# Patient Record
Sex: Female | Born: 1940 | Race: Black or African American | Hispanic: No | Smoking: Never smoker
Health system: Southern US, Community
[De-identification: ages and names within clinical notes are randomized; demographics above are authoritative.]

## PROBLEM LIST (undated history)

## (undated) DIAGNOSIS — E079 Disorder of thyroid, unspecified: Secondary | ICD-10-CM

## (undated) DIAGNOSIS — I10 Essential (primary) hypertension: Secondary | ICD-10-CM

## (undated) DIAGNOSIS — F0390 Unspecified dementia without behavioral disturbance: Secondary | ICD-10-CM

## (undated) HISTORY — DX: Unspecified dementia, unspecified severity, without behavioral disturbance, psychotic disturbance, mood disturbance, and anxiety: F03.90

## (undated) HISTORY — PX: ABDOMINAL HYSTERECTOMY: SHX81

---

## 2005-02-05 ENCOUNTER — Ambulatory Visit: Payer: Self-pay

## 2006-03-01 ENCOUNTER — Ambulatory Visit: Payer: Self-pay | Admitting: Internal Medicine

## 2007-03-10 ENCOUNTER — Ambulatory Visit: Payer: Self-pay | Admitting: Internal Medicine

## 2007-03-18 ENCOUNTER — Ambulatory Visit: Payer: Self-pay | Admitting: Internal Medicine

## 2008-01-30 ENCOUNTER — Ambulatory Visit: Payer: Self-pay | Admitting: Gastroenterology

## 2008-03-22 ENCOUNTER — Ambulatory Visit: Payer: Self-pay | Admitting: Gastroenterology

## 2008-06-07 ENCOUNTER — Ambulatory Visit: Payer: Self-pay | Admitting: Internal Medicine

## 2009-07-29 ENCOUNTER — Ambulatory Visit: Payer: Self-pay | Admitting: Internal Medicine

## 2010-07-30 ENCOUNTER — Ambulatory Visit: Payer: Self-pay | Admitting: Internal Medicine

## 2011-09-18 ENCOUNTER — Ambulatory Visit: Payer: Self-pay | Admitting: Internal Medicine

## 2012-09-20 ENCOUNTER — Ambulatory Visit: Payer: Self-pay | Admitting: Internal Medicine

## 2013-09-21 ENCOUNTER — Ambulatory Visit: Payer: Self-pay | Admitting: Internal Medicine

## 2014-10-17 ENCOUNTER — Ambulatory Visit: Payer: Self-pay | Admitting: Internal Medicine

## 2014-12-04 DIAGNOSIS — G309 Alzheimer's disease, unspecified: Secondary | ICD-10-CM | POA: Diagnosis not present

## 2014-12-04 DIAGNOSIS — E039 Hypothyroidism, unspecified: Secondary | ICD-10-CM | POA: Diagnosis not present

## 2014-12-04 DIAGNOSIS — I10 Essential (primary) hypertension: Secondary | ICD-10-CM | POA: Diagnosis not present

## 2014-12-04 DIAGNOSIS — D519 Vitamin B12 deficiency anemia, unspecified: Secondary | ICD-10-CM | POA: Diagnosis not present

## 2014-12-13 ENCOUNTER — Ambulatory Visit: Payer: Self-pay | Admitting: Internal Medicine

## 2014-12-13 DIAGNOSIS — R413 Other amnesia: Secondary | ICD-10-CM | POA: Diagnosis not present

## 2014-12-13 DIAGNOSIS — I679 Cerebrovascular disease, unspecified: Secondary | ICD-10-CM | POA: Diagnosis not present

## 2014-12-13 DIAGNOSIS — G319 Degenerative disease of nervous system, unspecified: Secondary | ICD-10-CM | POA: Diagnosis not present

## 2014-12-13 DIAGNOSIS — I638 Other cerebral infarction: Secondary | ICD-10-CM | POA: Diagnosis not present

## 2014-12-28 DIAGNOSIS — D519 Vitamin B12 deficiency anemia, unspecified: Secondary | ICD-10-CM | POA: Diagnosis not present

## 2015-01-04 DIAGNOSIS — G309 Alzheimer's disease, unspecified: Secondary | ICD-10-CM | POA: Diagnosis not present

## 2015-01-04 DIAGNOSIS — R5381 Other malaise: Secondary | ICD-10-CM | POA: Diagnosis not present

## 2015-01-04 DIAGNOSIS — D519 Vitamin B12 deficiency anemia, unspecified: Secondary | ICD-10-CM | POA: Diagnosis not present

## 2015-01-15 DIAGNOSIS — D519 Vitamin B12 deficiency anemia, unspecified: Secondary | ICD-10-CM | POA: Diagnosis not present

## 2015-01-15 DIAGNOSIS — M810 Age-related osteoporosis without current pathological fracture: Secondary | ICD-10-CM | POA: Diagnosis not present

## 2015-01-15 DIAGNOSIS — E039 Hypothyroidism, unspecified: Secondary | ICD-10-CM | POA: Diagnosis not present

## 2015-01-15 DIAGNOSIS — I10 Essential (primary) hypertension: Secondary | ICD-10-CM | POA: Diagnosis not present

## 2015-01-15 DIAGNOSIS — G309 Alzheimer's disease, unspecified: Secondary | ICD-10-CM | POA: Diagnosis not present

## 2015-01-25 DIAGNOSIS — D519 Vitamin B12 deficiency anemia, unspecified: Secondary | ICD-10-CM | POA: Diagnosis not present

## 2015-02-11 DIAGNOSIS — E039 Hypothyroidism, unspecified: Secondary | ICD-10-CM | POA: Diagnosis not present

## 2015-02-12 DIAGNOSIS — H2513 Age-related nuclear cataract, bilateral: Secondary | ICD-10-CM | POA: Diagnosis not present

## 2015-05-06 ENCOUNTER — Ambulatory Visit
Admission: RE | Admit: 2015-05-06 | Discharge: 2015-05-06 | Disposition: A | Discharge status: Home / Self Care | Payer: Medicare Other | Source: Ambulatory Visit | Attending: Physician Assistant | Admitting: Physician Assistant

## 2015-05-06 ENCOUNTER — Other Ambulatory Visit: Payer: Self-pay | Admitting: Physician Assistant

## 2015-05-06 DIAGNOSIS — R069 Unspecified abnormalities of breathing: Secondary | ICD-10-CM

## 2015-05-06 DIAGNOSIS — R0689 Other abnormalities of breathing: Secondary | ICD-10-CM | POA: Diagnosis not present

## 2015-05-06 DIAGNOSIS — R634 Abnormal weight loss: Secondary | ICD-10-CM | POA: Diagnosis not present

## 2015-05-06 DIAGNOSIS — E860 Dehydration: Secondary | ICD-10-CM | POA: Diagnosis not present

## 2015-05-06 DIAGNOSIS — G309 Alzheimer's disease, unspecified: Secondary | ICD-10-CM | POA: Diagnosis not present

## 2015-05-06 DIAGNOSIS — R42 Dizziness and giddiness: Secondary | ICD-10-CM | POA: Diagnosis not present

## 2015-05-06 DIAGNOSIS — D519 Vitamin B12 deficiency anemia, unspecified: Secondary | ICD-10-CM | POA: Diagnosis not present

## 2015-05-06 DIAGNOSIS — N390 Urinary tract infection, site not specified: Secondary | ICD-10-CM | POA: Diagnosis not present

## 2015-05-06 DIAGNOSIS — E039 Hypothyroidism, unspecified: Secondary | ICD-10-CM | POA: Diagnosis not present

## 2015-05-09 DIAGNOSIS — E039 Hypothyroidism, unspecified: Secondary | ICD-10-CM | POA: Diagnosis not present

## 2015-05-09 DIAGNOSIS — G309 Alzheimer's disease, unspecified: Secondary | ICD-10-CM | POA: Diagnosis not present

## 2015-05-09 DIAGNOSIS — D519 Vitamin B12 deficiency anemia, unspecified: Secondary | ICD-10-CM | POA: Diagnosis not present

## 2015-05-09 DIAGNOSIS — N390 Urinary tract infection, site not specified: Secondary | ICD-10-CM | POA: Diagnosis not present

## 2015-05-09 DIAGNOSIS — D72819 Decreased white blood cell count, unspecified: Secondary | ICD-10-CM | POA: Diagnosis not present

## 2015-05-31 DIAGNOSIS — E039 Hypothyroidism, unspecified: Secondary | ICD-10-CM | POA: Diagnosis not present

## 2015-06-04 DIAGNOSIS — I10 Essential (primary) hypertension: Secondary | ICD-10-CM | POA: Diagnosis not present

## 2015-06-04 DIAGNOSIS — D519 Vitamin B12 deficiency anemia, unspecified: Secondary | ICD-10-CM | POA: Diagnosis not present

## 2015-06-04 DIAGNOSIS — E039 Hypothyroidism, unspecified: Secondary | ICD-10-CM | POA: Diagnosis not present

## 2015-06-04 DIAGNOSIS — G309 Alzheimer's disease, unspecified: Secondary | ICD-10-CM | POA: Diagnosis not present

## 2015-06-16 ENCOUNTER — Encounter: Payer: Self-pay | Admitting: Emergency Medicine

## 2015-06-16 ENCOUNTER — Other Ambulatory Visit: Payer: Self-pay

## 2015-06-16 ENCOUNTER — Emergency Department: Payer: Medicare Other

## 2015-06-16 ENCOUNTER — Emergency Department
Admission: EM | Admit: 2015-06-16 | Discharge: 2015-06-16 | Disposition: A | Discharge status: Home / Self Care | Payer: Medicare Other | Attending: Emergency Medicine | Admitting: Emergency Medicine

## 2015-06-16 DIAGNOSIS — Z880 Allergy status to penicillin: Secondary | ICD-10-CM | POA: Diagnosis not present

## 2015-06-16 DIAGNOSIS — I10 Essential (primary) hypertension: Secondary | ICD-10-CM | POA: Diagnosis not present

## 2015-06-16 DIAGNOSIS — R110 Nausea: Secondary | ICD-10-CM | POA: Diagnosis not present

## 2015-06-16 DIAGNOSIS — R109 Unspecified abdominal pain: Secondary | ICD-10-CM | POA: Diagnosis not present

## 2015-06-16 HISTORY — DX: Disorder of thyroid, unspecified: E07.9

## 2015-06-16 HISTORY — DX: Essential (primary) hypertension: I10

## 2015-06-16 LAB — CBC WITH DIFFERENTIAL/PLATELET
Basophils Absolute: 0 10*3/uL (ref 0–0.1)
Basophils Relative: 0 %
Eosinophils Absolute: 0 10*3/uL (ref 0–0.7)
Eosinophils Relative: 0 %
HCT: 39.1 % (ref 35.0–47.0)
Hemoglobin: 12.3 g/dL (ref 12.0–16.0)
Lymphocytes Relative: 18 %
Lymphs Abs: 0.5 10*3/uL — ABNORMAL LOW (ref 1.0–3.6)
MCH: 26 pg (ref 26.0–34.0)
MCHC: 31.6 g/dL — ABNORMAL LOW (ref 32.0–36.0)
MCV: 82.4 fL (ref 80.0–100.0)
Monocytes Absolute: 0.3 10*3/uL (ref 0.2–0.9)
Monocytes Relative: 11 %
Neutro Abs: 2.2 10*3/uL (ref 1.4–6.5)
Neutrophils Relative %: 71 %
Platelets: 191 10*3/uL (ref 150–440)
RBC: 4.74 MIL/uL (ref 3.80–5.20)
RDW: 14.1 % (ref 11.5–14.5)
WBC: 3 10*3/uL — ABNORMAL LOW (ref 3.6–11.0)

## 2015-06-16 LAB — COMPREHENSIVE METABOLIC PANEL
ALT: 23 U/L (ref 14–54)
AST: 28 U/L (ref 15–41)
Albumin: 4.4 g/dL (ref 3.5–5.0)
Alkaline Phosphatase: 78 U/L (ref 38–126)
Anion gap: 8 (ref 5–15)
BUN: 27 mg/dL — ABNORMAL HIGH (ref 6–20)
CO2: 30 mmol/L (ref 22–32)
Calcium: 9.7 mg/dL (ref 8.9–10.3)
Chloride: 101 mmol/L (ref 101–111)
Creatinine, Ser: 0.82 mg/dL (ref 0.44–1.00)
GFR calc Af Amer: >60 mL/min (ref >60)
GFR calc non Af Amer: >60 mL/min (ref >60)
Glucose, Bld: 142 mg/dL — ABNORMAL HIGH (ref 65–99)
Potassium: 3.7 mmol/L (ref 3.5–5.1)
Sodium: 139 mmol/L (ref 135–145)
Total Bilirubin: 0.6 mg/dL (ref 0.3–1.2)
Total Protein: 8.5 g/dL — ABNORMAL HIGH (ref 6.5–8.1)

## 2015-06-16 LAB — URINALYSIS COMPLETE WITH MICROSCOPIC (ARMC ONLY)
Bacteria, UA: NONE SEEN
Bilirubin Urine: NEGATIVE
Glucose, UA: NEGATIVE mg/dL
Hgb urine dipstick: NEGATIVE
Ketones, ur: NEGATIVE mg/dL
Leukocytes, UA: NEGATIVE
Nitrite: NEGATIVE
Protein, ur: NEGATIVE mg/dL
RBC / HPF: NONE SEEN RBC/hpf (ref 0–5)
Specific Gravity, Urine: 1.01 (ref 1.005–1.030)
pH: 7 (ref 5.0–8.0)

## 2015-06-16 LAB — LIPASE, BLOOD: Lipase: 52 U/L — ABNORMAL HIGH (ref 22–51)

## 2015-06-16 MED ORDER — ONDANSETRON HCL 4 MG/2ML IJ SOLN
4.0000 mg | Freq: Once | INTRAMUSCULAR | Status: AC
  Administered 2015-06-16: 4 mg via INTRAVENOUS
  Filled 2015-06-16: qty 2

## 2015-06-16 MED ORDER — SODIUM CHLORIDE 0.9 % IV BOLUS (SEPSIS)
500.0000 mL | Freq: Once | INTRAVENOUS | Status: AC
  Administered 2015-06-16: 500 mL via INTRAVENOUS

## 2015-06-16 MED ORDER — ONDANSETRON HCL 4 MG PO TABS: 4.0000 mg | ORAL_TABLET | Freq: Every day | ORAL | Status: DC | PRN

## 2015-06-16 NOTE — ED Provider Notes (Signed)
[facility] Emergency Department Provider Note  ____________________________________________  Time seen: 1505 I have reviewed the triage vital signs and the nursing notes.   HISTORY  Chief Complaint Abdominal Pain     HPI Joanna Aguilar is a 74 y.o. female who presents to the emergency department today with nausea. Her family is with her and helps with history, although the patient is quite alert and communicative and does not appear to need much help in conveying her symptoms.  She reports she awoke this morning and felt fine. She was getting ready for church and was premature ready when she began to feel nauseous. This lasted for approximately 15 minutes. She lay down on the bed and the symptoms slowly eased. She has been feeling fairly well since then although she began to have some nausea in the half hour before I saw her here in the emergency department.  The patient denies any abdominal pain, although she does report she gets a funny feeling around her belly button at times. She has not had any diarrhea. While she is nauseous, she has not had any actual emesis.  The symptoms initially began 2-3 months ago. The patient and family report that it happens 2-3 times per week. The patient has discussed this with her primary physician, Dr. Humphrey Rolls at [facility]. She was advised to come to the emergency department time this occurred.  The patient denies any headache or weakness. She is not having any chest pain. She is not having any syncope or near syncopal episodes.   Past Medical History  Diagnosis Date  . Thyroid disease   . Hypertension     There are no active problems to display for this patient.   Past Surgical History  Procedure Laterality Date  . Abdominal hysterectomy      Current Outpatient Rx  Name  Route  Sig  Dispense  Refill  . ondansetron (ZOFRAN) 4 MG tablet   Oral   Take 1 tablet (4 mg total) by mouth daily as needed for nausea or  vomiting.   10 tablet   0     Allergies Penicillins  History reviewed. No pertinent family history.  Social History History  Substance Use Topics  . Smoking status: Never Smoker   . Smokeless tobacco: Not on file  . Alcohol Use: No    Review of Systems  Constitutional: Negative for fever. ENT: Negative for sore throat. Cardiovascular: Negative for chest pain. Respiratory: Negative for shortness of breath. Gastrointestinal: Negative for abdominal pain, vomiting and diarrhea. Positive for nausea Genitourinary: Negative for dysuria. Musculoskeletal: No myalgias or injuries. Skin: Negative for rash. Neurological: Negative for headaches   10-point ROS otherwise negative.  ____________________________________________   PHYSICAL EXAM:  VITAL SIGNS: ED Triage Vitals  Enc Vitals Group     BP 06/16/15 1149 154/83 mmHg     Pulse Rate 06/16/15 1149 70     Resp 06/16/15 1149 18     Temp 06/16/15 1149 97 F (36.1 C)     Temp Source 06/16/15 1149 Oral     SpO2 06/16/15 1149 97 %     Weight 06/16/15 1149 94 lb (42.638 kg)     Height 06/16/15 1149 5\' 2"  (1.575 m)     Head Cir --      Peak Flow --      Pain Score 06/16/15 1149 5     Pain Loc --      Pain Edu? --  Excl. in [place]? --     Constitutional: Alert and communicative.  Well appearing and in no distress. ENT   Head: Normocephalic and atraumatic.   Nose: No congestion/rhinnorhea.      Ears: Normal canals and TMs. There is a small amount of wax in the left canal with no obstruction.   Mouth/Throat: Mucous membranes are moist. Cardiovascular: Normal rate, regular rhythm, no murmur noted Respiratory:  Normal respiratory effort, no tachypnea.    Breath sounds are clear and equal bilaterally.  Gastrointestinal: Soft and nontender. Muscular. No distention.  Back: No muscle spasm, no tenderness, no CVA tenderness. Musculoskeletal: No deformity noted. Nontender with normal range of motion in all  extremities.  No noted edema. Neurologic:  Normal speech and language. No gross focal neurologic deficits are appreciated.  Skin:  Skin is warm, dry. No rash noted. Psychiatric: Mood and affect are normal. Speech and behavior are normal.  ____________________________________________    LABS (pertinent positives/negatives)  Labs Reviewed  CBC WITH DIFFERENTIAL/PLATELET - Abnormal; Notable for the following:    WBC 3.0 (*)    MCHC 31.6 (*)    Lymphs Abs 0.5 (*)    All other components within normal limits  COMPREHENSIVE METABOLIC PANEL - Abnormal; Notable for the following:    Glucose, Bld 142 (*)    BUN 27 (*)    Total Protein 8.5 (*)    All other components within normal limits  LIPASE, BLOOD - Abnormal; Notable for the following:    Lipase 52 (*)    All other components within normal limits  URINALYSIS COMPLETEWITH MICROSCOPIC ([facility] ONLY) - Abnormal; Notable for the following:    Color, Urine STRAW (*)    APPearance CLEAR (*)    Squamous Epithelial / LPF 0-5 (*)    All other components within normal limits     ____________________________________________   EKG  ED ECG REPORT I, Vedha Tercero W, the attending physician, personally viewed and interpreted this ECG.   Date: 06/16/2015  EKG Time: 1202  Rate: 80  Rhythm: Normal sinus rhythm  Axis: Normal  Intervals: Normal  ST&T Change: Downward T in lead 3 and aVF   ____________________________________________    RADIOLOGY  Ultrasound right upper quadrant:  IMPRESSION: Negative right upper quadrant ultrasound.  ____________________________________________   INITIAL IMPRESSION / ASSESSMENT AND PLAN / ED COURSE  Pertinent labs & imaging results that were available during my care of the patient were reviewed by me and considered in my medical decision making (see chart for details).  Pleasant, well-appearing 74 year old female who in no acute distress. Interview was prolonged as be clarified her symptoms  and what was in was not happening. She is not having any fainting or near syncopal episodes. She is not having any abdominal pain. She has intermittent nausea that lasted 15 minutes today. These episodes occur 2-3 times a week and began to 3 months ago.  I will order Zofran for her and obtain an ultrasound of the right quadrant to be sure she is not having any biliary disease contributing to this nausea. I do have low suspicion for this. If the ultrasound is normal we will discharge her home with his prescription for Zofran and instructions to follow-up with her primary physician.  ----------------------------------------- 5:41 PM on 06/16/2015 -----------------------------------------  Patient has a normal right upper quadrant ultrasound. No biliary disease.  On reexam, she is not having any nausea at this time. We'll prescribe Zofran for home use. She will follow with her primary care physician.  ____________________________________________   FINAL CLINICAL IMPRESSION(S) / ED DIAGNOSES  Final diagnoses:  Nausea      Ahmed Prima, MD 06/16/15 1744

## 2015-06-16 NOTE — ED Notes (Signed)
Woke up with feeling sick on stomach  Nausea .

## 2015-06-16 NOTE — Discharge Instructions (Signed)
It is unclear what is causing your intermittent nausea. Your blood tests overall look good. Your ultrasound does not show any acute problems with her gallbladder. You felt better after receiving Zofran and some fluids here in the emergency department. We have prescribed Zofran to take in case of episodes of nausea. Follow-up with your regular doctor for ongoing evaluation. Return to the emergency department if you have worsening symptoms, abdominal pain, or other urgent concerns.   Nausea, Adult Nausea means you feel sick to your stomach or need to throw up (vomit). It may be a sign of a more serious problem. If nausea gets worse, you may throw up. If you throw up a lot, you may lose too much body fluid (dehydration). HOME CARE   Get plenty of rest.  Ask your doctor how to replace body fluid losses (rehydrate).  Eat small amounts of food. Sip liquids more often.  Take all medicines as told by your doctor. GET HELP RIGHT AWAY IF:  You have a fever.  You pass out (faint).  You keep throwing up or have blood in your throw up.  You are very weak, have dry lips or a dry mouth, or you are very thirsty (dehydrated).  You have dark or bloody poop (stool).  You have very bad chest or belly (abdominal) pain.  You do not get better after 2 days, or you get worse.  You have a headache. MAKE SURE YOU:  Understand these instructions.  Will watch your condition.  Will get help right away if you are not doing well or get worse. Document Released: 10/22/2011 Document Revised: 01/25/2012 Document Reviewed: 10/22/2011 [facility] Patient Information 2015 [place], [place]. This information is not intended to replace advice given to you by your health care provider. Make sure you discuss any questions you have with your health care provider.

## 2015-06-16 NOTE — Progress Notes (Addendum)
   06/16/15 1800  Clinical Encounter Type  Visited With Family  Visit Type Spiritual support  Spiritual Encounters  Spiritual Needs Prayer  Stress Factors  Family Stress Factors Health changes   Status: discharged/73 female/Peripheral IV 06/16/15 Right Antecubital Family: daughter and female relative Visit Assessment: Prior to discharging, the patient's family introduced themselves and had a jovial conversation with the chaplain down in the cafeteria. The chaplain gave the family encouraging words and will lift the patient and her family up in prayer for healing and comfort. The patient's daughter shared that her mother was in the ER.  Pastoral care's pager number is [phone] or we can be reached online

## 2015-07-04 DIAGNOSIS — I10 Essential (primary) hypertension: Secondary | ICD-10-CM | POA: Diagnosis not present

## 2015-07-04 DIAGNOSIS — E039 Hypothyroidism, unspecified: Secondary | ICD-10-CM | POA: Diagnosis not present

## 2015-07-09 DIAGNOSIS — G309 Alzheimer's disease, unspecified: Secondary | ICD-10-CM | POA: Diagnosis not present

## 2015-07-09 DIAGNOSIS — E039 Hypothyroidism, unspecified: Secondary | ICD-10-CM | POA: Diagnosis not present

## 2015-07-09 DIAGNOSIS — D519 Vitamin B12 deficiency anemia, unspecified: Secondary | ICD-10-CM | POA: Diagnosis not present

## 2015-07-09 DIAGNOSIS — I10 Essential (primary) hypertension: Secondary | ICD-10-CM | POA: Diagnosis not present

## 2015-07-24 DIAGNOSIS — M818 Other osteoporosis without current pathological fracture: Secondary | ICD-10-CM | POA: Diagnosis not present

## 2015-07-24 DIAGNOSIS — I10 Essential (primary) hypertension: Secondary | ICD-10-CM | POA: Diagnosis not present

## 2015-07-24 DIAGNOSIS — R600 Localized edema: Secondary | ICD-10-CM | POA: Diagnosis not present

## 2015-07-24 DIAGNOSIS — E0500 Thyrotoxicosis with diffuse goiter without thyrotoxic crisis or storm: Secondary | ICD-10-CM | POA: Diagnosis not present

## 2015-07-24 DIAGNOSIS — E890 Postprocedural hypothyroidism: Secondary | ICD-10-CM | POA: Diagnosis not present

## 2015-07-31 DIAGNOSIS — M818 Other osteoporosis without current pathological fracture: Secondary | ICD-10-CM | POA: Diagnosis not present

## 2015-07-31 DIAGNOSIS — E890 Postprocedural hypothyroidism: Secondary | ICD-10-CM | POA: Diagnosis not present

## 2015-07-31 DIAGNOSIS — I10 Essential (primary) hypertension: Secondary | ICD-10-CM | POA: Diagnosis not present

## 2015-07-31 DIAGNOSIS — E0500 Thyrotoxicosis with diffuse goiter without thyrotoxic crisis or storm: Secondary | ICD-10-CM | POA: Diagnosis not present

## 2015-08-06 DIAGNOSIS — E890 Postprocedural hypothyroidism: Secondary | ICD-10-CM | POA: Diagnosis not present

## 2015-08-06 DIAGNOSIS — D519 Vitamin B12 deficiency anemia, unspecified: Secondary | ICD-10-CM | POA: Diagnosis not present

## 2015-08-06 DIAGNOSIS — M818 Other osteoporosis without current pathological fracture: Secondary | ICD-10-CM | POA: Diagnosis not present

## 2015-08-06 DIAGNOSIS — E0500 Thyrotoxicosis with diffuse goiter without thyrotoxic crisis or storm: Secondary | ICD-10-CM | POA: Diagnosis not present

## 2015-08-06 DIAGNOSIS — R600 Localized edema: Secondary | ICD-10-CM | POA: Diagnosis not present

## 2015-08-13 DIAGNOSIS — E890 Postprocedural hypothyroidism: Secondary | ICD-10-CM | POA: Diagnosis not present

## 2015-08-13 DIAGNOSIS — R600 Localized edema: Secondary | ICD-10-CM | POA: Diagnosis not present

## 2015-08-13 DIAGNOSIS — I10 Essential (primary) hypertension: Secondary | ICD-10-CM | POA: Diagnosis not present

## 2015-08-13 DIAGNOSIS — E0500 Thyrotoxicosis with diffuse goiter without thyrotoxic crisis or storm: Secondary | ICD-10-CM | POA: Diagnosis not present

## 2015-08-13 DIAGNOSIS — M818 Other osteoporosis without current pathological fracture: Secondary | ICD-10-CM | POA: Diagnosis not present

## 2015-08-14 DIAGNOSIS — M818 Other osteoporosis without current pathological fracture: Secondary | ICD-10-CM | POA: Diagnosis not present

## 2015-08-14 DIAGNOSIS — E0500 Thyrotoxicosis with diffuse goiter without thyrotoxic crisis or storm: Secondary | ICD-10-CM | POA: Diagnosis not present

## 2015-08-14 DIAGNOSIS — E890 Postprocedural hypothyroidism: Secondary | ICD-10-CM | POA: Diagnosis not present

## 2015-09-06 DIAGNOSIS — M818 Other osteoporosis without current pathological fracture: Secondary | ICD-10-CM | POA: Diagnosis not present

## 2015-09-06 DIAGNOSIS — E0500 Thyrotoxicosis with diffuse goiter without thyrotoxic crisis or storm: Secondary | ICD-10-CM | POA: Diagnosis not present

## 2015-09-06 DIAGNOSIS — E890 Postprocedural hypothyroidism: Secondary | ICD-10-CM | POA: Diagnosis not present

## 2015-09-06 DIAGNOSIS — R600 Localized edema: Secondary | ICD-10-CM | POA: Diagnosis not present

## 2015-09-13 DIAGNOSIS — E0500 Thyrotoxicosis with diffuse goiter without thyrotoxic crisis or storm: Secondary | ICD-10-CM | POA: Diagnosis not present

## 2015-09-13 DIAGNOSIS — D519 Vitamin B12 deficiency anemia, unspecified: Secondary | ICD-10-CM | POA: Diagnosis not present

## 2015-09-13 DIAGNOSIS — I10 Essential (primary) hypertension: Secondary | ICD-10-CM | POA: Diagnosis not present

## 2015-09-13 DIAGNOSIS — E890 Postprocedural hypothyroidism: Secondary | ICD-10-CM | POA: Diagnosis not present

## 2015-09-13 DIAGNOSIS — R600 Localized edema: Secondary | ICD-10-CM | POA: Diagnosis not present

## 2015-10-22 DIAGNOSIS — E890 Postprocedural hypothyroidism: Secondary | ICD-10-CM | POA: Diagnosis not present

## 2015-10-22 DIAGNOSIS — D519 Vitamin B12 deficiency anemia, unspecified: Secondary | ICD-10-CM | POA: Diagnosis not present

## 2015-10-22 DIAGNOSIS — E0500 Thyrotoxicosis with diffuse goiter without thyrotoxic crisis or storm: Secondary | ICD-10-CM | POA: Diagnosis not present

## 2015-10-25 DIAGNOSIS — R600 Localized edema: Secondary | ICD-10-CM | POA: Diagnosis not present

## 2015-10-25 DIAGNOSIS — E0500 Thyrotoxicosis with diffuse goiter without thyrotoxic crisis or storm: Secondary | ICD-10-CM | POA: Diagnosis not present

## 2015-10-25 DIAGNOSIS — I10 Essential (primary) hypertension: Secondary | ICD-10-CM | POA: Diagnosis not present

## 2015-10-25 DIAGNOSIS — E890 Postprocedural hypothyroidism: Secondary | ICD-10-CM | POA: Diagnosis not present

## 2015-11-13 ENCOUNTER — Other Ambulatory Visit: Payer: Self-pay | Admitting: Physician Assistant

## 2015-11-13 DIAGNOSIS — Z0001 Encounter for general adult medical examination with abnormal findings: Secondary | ICD-10-CM | POA: Diagnosis not present

## 2015-11-13 DIAGNOSIS — Z1231 Encounter for screening mammogram for malignant neoplasm of breast: Secondary | ICD-10-CM

## 2015-11-13 DIAGNOSIS — D519 Vitamin B12 deficiency anemia, unspecified: Secondary | ICD-10-CM | POA: Diagnosis not present

## 2015-11-13 DIAGNOSIS — G309 Alzheimer's disease, unspecified: Secondary | ICD-10-CM | POA: Diagnosis not present

## 2015-11-13 DIAGNOSIS — I10 Essential (primary) hypertension: Secondary | ICD-10-CM | POA: Diagnosis not present

## 2015-11-13 DIAGNOSIS — E039 Hypothyroidism, unspecified: Secondary | ICD-10-CM | POA: Diagnosis not present

## 2015-11-13 DIAGNOSIS — Z7689 Persons encountering health services in other specified circumstances: Secondary | ICD-10-CM | POA: Diagnosis not present

## 2015-11-21 ENCOUNTER — Ambulatory Visit
Admission: RE | Admit: 2015-11-21 | Discharge: 2015-11-21 | Disposition: A | Discharge status: Home / Self Care | Payer: Medicare Other | Source: Ambulatory Visit | Attending: Physician Assistant | Admitting: Physician Assistant

## 2015-11-21 ENCOUNTER — Other Ambulatory Visit: Payer: Self-pay | Admitting: Physician Assistant

## 2015-11-21 DIAGNOSIS — Z1231 Encounter for screening mammogram for malignant neoplasm of breast: Secondary | ICD-10-CM | POA: Insufficient documentation

## 2015-12-20 DIAGNOSIS — D519 Vitamin B12 deficiency anemia, unspecified: Secondary | ICD-10-CM | POA: Diagnosis not present

## 2016-01-17 DIAGNOSIS — E0500 Thyrotoxicosis with diffuse goiter without thyrotoxic crisis or storm: Secondary | ICD-10-CM | POA: Diagnosis not present

## 2016-01-24 DIAGNOSIS — M818 Other osteoporosis without current pathological fracture: Secondary | ICD-10-CM | POA: Diagnosis not present

## 2016-01-24 DIAGNOSIS — D519 Vitamin B12 deficiency anemia, unspecified: Secondary | ICD-10-CM | POA: Diagnosis not present

## 2016-01-24 DIAGNOSIS — E890 Postprocedural hypothyroidism: Secondary | ICD-10-CM | POA: Diagnosis not present

## 2016-01-24 DIAGNOSIS — I10 Essential (primary) hypertension: Secondary | ICD-10-CM | POA: Diagnosis not present

## 2016-01-24 DIAGNOSIS — E0500 Thyrotoxicosis with diffuse goiter without thyrotoxic crisis or storm: Secondary | ICD-10-CM | POA: Diagnosis not present

## 2016-02-19 DIAGNOSIS — D519 Vitamin B12 deficiency anemia, unspecified: Secondary | ICD-10-CM | POA: Diagnosis not present

## 2016-03-12 DIAGNOSIS — Z0001 Encounter for general adult medical examination with abnormal findings: Secondary | ICD-10-CM | POA: Diagnosis not present

## 2016-03-12 DIAGNOSIS — D519 Vitamin B12 deficiency anemia, unspecified: Secondary | ICD-10-CM | POA: Diagnosis not present

## 2016-03-12 DIAGNOSIS — I10 Essential (primary) hypertension: Secondary | ICD-10-CM | POA: Diagnosis not present

## 2016-03-12 DIAGNOSIS — G309 Alzheimer's disease, unspecified: Secondary | ICD-10-CM | POA: Diagnosis not present

## 2016-03-12 DIAGNOSIS — M810 Age-related osteoporosis without current pathological fracture: Secondary | ICD-10-CM | POA: Diagnosis not present

## 2016-03-12 DIAGNOSIS — E039 Hypothyroidism, unspecified: Secondary | ICD-10-CM | POA: Diagnosis not present

## 2016-04-24 DIAGNOSIS — M818 Other osteoporosis without current pathological fracture: Secondary | ICD-10-CM | POA: Diagnosis not present

## 2016-04-24 DIAGNOSIS — E0500 Thyrotoxicosis with diffuse goiter without thyrotoxic crisis or storm: Secondary | ICD-10-CM | POA: Diagnosis not present

## 2016-04-24 DIAGNOSIS — D519 Vitamin B12 deficiency anemia, unspecified: Secondary | ICD-10-CM | POA: Diagnosis not present

## 2016-05-01 DIAGNOSIS — I10 Essential (primary) hypertension: Secondary | ICD-10-CM | POA: Diagnosis not present

## 2016-05-01 DIAGNOSIS — M818 Other osteoporosis without current pathological fracture: Secondary | ICD-10-CM | POA: Diagnosis not present

## 2016-05-01 DIAGNOSIS — E890 Postprocedural hypothyroidism: Secondary | ICD-10-CM | POA: Diagnosis not present

## 2016-05-21 DIAGNOSIS — D519 Vitamin B12 deficiency anemia, unspecified: Secondary | ICD-10-CM | POA: Diagnosis not present

## 2016-06-24 DIAGNOSIS — D519 Vitamin B12 deficiency anemia, unspecified: Secondary | ICD-10-CM | POA: Diagnosis not present

## 2016-07-13 DIAGNOSIS — D519 Vitamin B12 deficiency anemia, unspecified: Secondary | ICD-10-CM | POA: Diagnosis not present

## 2016-07-13 DIAGNOSIS — I10 Essential (primary) hypertension: Secondary | ICD-10-CM | POA: Diagnosis not present

## 2016-07-13 DIAGNOSIS — J309 Allergic rhinitis, unspecified: Secondary | ICD-10-CM | POA: Diagnosis not present

## 2016-07-13 DIAGNOSIS — E039 Hypothyroidism, unspecified: Secondary | ICD-10-CM | POA: Diagnosis not present

## 2016-08-12 DIAGNOSIS — D519 Vitamin B12 deficiency anemia, unspecified: Secondary | ICD-10-CM | POA: Diagnosis not present

## 2016-09-09 DIAGNOSIS — D519 Vitamin B12 deficiency anemia, unspecified: Secondary | ICD-10-CM | POA: Diagnosis not present

## 2016-10-15 DIAGNOSIS — D519 Vitamin B12 deficiency anemia, unspecified: Secondary | ICD-10-CM | POA: Diagnosis not present

## 2016-10-16 ENCOUNTER — Other Ambulatory Visit: Payer: Self-pay | Admitting: Physician Assistant

## 2016-10-16 ENCOUNTER — Other Ambulatory Visit: Payer: Self-pay | Admitting: Internal Medicine

## 2016-10-16 DIAGNOSIS — Z1231 Encounter for screening mammogram for malignant neoplasm of breast: Secondary | ICD-10-CM

## 2016-10-30 DIAGNOSIS — M818 Other osteoporosis without current pathological fracture: Secondary | ICD-10-CM | POA: Diagnosis not present

## 2016-10-30 DIAGNOSIS — I10 Essential (primary) hypertension: Secondary | ICD-10-CM | POA: Diagnosis not present

## 2016-10-30 DIAGNOSIS — E0500 Thyrotoxicosis with diffuse goiter without thyrotoxic crisis or storm: Secondary | ICD-10-CM | POA: Diagnosis not present

## 2016-10-30 DIAGNOSIS — E890 Postprocedural hypothyroidism: Secondary | ICD-10-CM | POA: Diagnosis not present

## 2016-10-30 DIAGNOSIS — R600 Localized edema: Secondary | ICD-10-CM | POA: Diagnosis not present

## 2016-11-03 DIAGNOSIS — E039 Hypothyroidism, unspecified: Secondary | ICD-10-CM | POA: Diagnosis not present

## 2016-11-03 DIAGNOSIS — I10 Essential (primary) hypertension: Secondary | ICD-10-CM | POA: Diagnosis not present

## 2016-11-03 DIAGNOSIS — D519 Vitamin B12 deficiency anemia, unspecified: Secondary | ICD-10-CM | POA: Diagnosis not present

## 2016-11-03 DIAGNOSIS — G309 Alzheimer's disease, unspecified: Secondary | ICD-10-CM | POA: Diagnosis not present

## 2016-11-13 DIAGNOSIS — E890 Postprocedural hypothyroidism: Secondary | ICD-10-CM | POA: Diagnosis not present

## 2016-11-13 DIAGNOSIS — M818 Other osteoporosis without current pathological fracture: Secondary | ICD-10-CM | POA: Diagnosis not present

## 2016-11-13 DIAGNOSIS — E0500 Thyrotoxicosis with diffuse goiter without thyrotoxic crisis or storm: Secondary | ICD-10-CM | POA: Diagnosis not present

## 2016-11-13 DIAGNOSIS — R600 Localized edema: Secondary | ICD-10-CM | POA: Diagnosis not present

## 2016-11-26 ENCOUNTER — Ambulatory Visit
Admission: RE | Admit: 2016-11-26 | Discharge: 2016-11-26 | Disposition: A | Discharge status: Home / Self Care | Payer: Medicare Other | Source: Ambulatory Visit | Attending: Internal Medicine | Admitting: Internal Medicine

## 2016-11-26 DIAGNOSIS — Z1231 Encounter for screening mammogram for malignant neoplasm of breast: Secondary | ICD-10-CM | POA: Insufficient documentation

## 2016-12-31 DIAGNOSIS — D519 Vitamin B12 deficiency anemia, unspecified: Secondary | ICD-10-CM | POA: Diagnosis not present

## 2017-03-15 DIAGNOSIS — D519 Vitamin B12 deficiency anemia, unspecified: Secondary | ICD-10-CM | POA: Diagnosis not present

## 2017-03-15 DIAGNOSIS — I10 Essential (primary) hypertension: Secondary | ICD-10-CM | POA: Diagnosis not present

## 2017-03-15 DIAGNOSIS — R112 Nausea with vomiting, unspecified: Secondary | ICD-10-CM | POA: Diagnosis not present

## 2017-03-31 DIAGNOSIS — H6123 Impacted cerumen, bilateral: Secondary | ICD-10-CM | POA: Diagnosis not present

## 2017-03-31 DIAGNOSIS — H6691 Otitis media, unspecified, right ear: Secondary | ICD-10-CM | POA: Diagnosis not present

## 2017-03-31 DIAGNOSIS — J0100 Acute maxillary sinusitis, unspecified: Secondary | ICD-10-CM | POA: Diagnosis not present

## 2017-04-14 DIAGNOSIS — D519 Vitamin B12 deficiency anemia, unspecified: Secondary | ICD-10-CM | POA: Diagnosis not present

## 2017-05-13 DIAGNOSIS — E890 Postprocedural hypothyroidism: Secondary | ICD-10-CM | POA: Diagnosis not present

## 2017-05-13 DIAGNOSIS — E0500 Thyrotoxicosis with diffuse goiter without thyrotoxic crisis or storm: Secondary | ICD-10-CM | POA: Diagnosis not present

## 2017-05-13 DIAGNOSIS — M818 Other osteoporosis without current pathological fracture: Secondary | ICD-10-CM | POA: Diagnosis not present

## 2017-05-13 DIAGNOSIS — I10 Essential (primary) hypertension: Secondary | ICD-10-CM | POA: Diagnosis not present

## 2017-05-13 DIAGNOSIS — R600 Localized edema: Secondary | ICD-10-CM | POA: Diagnosis not present

## 2017-05-27 DIAGNOSIS — R600 Localized edema: Secondary | ICD-10-CM | POA: Diagnosis not present

## 2017-05-27 DIAGNOSIS — E0500 Thyrotoxicosis with diffuse goiter without thyrotoxic crisis or storm: Secondary | ICD-10-CM | POA: Diagnosis not present

## 2017-05-27 DIAGNOSIS — I10 Essential (primary) hypertension: Secondary | ICD-10-CM | POA: Diagnosis not present

## 2017-05-27 DIAGNOSIS — M818 Other osteoporosis without current pathological fracture: Secondary | ICD-10-CM | POA: Diagnosis not present

## 2017-06-01 DIAGNOSIS — D519 Vitamin B12 deficiency anemia, unspecified: Secondary | ICD-10-CM | POA: Diagnosis not present

## 2017-06-01 DIAGNOSIS — E785 Hyperlipidemia, unspecified: Secondary | ICD-10-CM | POA: Diagnosis not present

## 2017-06-01 DIAGNOSIS — D649 Anemia, unspecified: Secondary | ICD-10-CM | POA: Diagnosis not present

## 2017-06-01 DIAGNOSIS — Z0001 Encounter for general adult medical examination with abnormal findings: Secondary | ICD-10-CM | POA: Diagnosis not present

## 2017-06-01 DIAGNOSIS — E039 Hypothyroidism, unspecified: Secondary | ICD-10-CM | POA: Diagnosis not present

## 2017-06-01 DIAGNOSIS — G309 Alzheimer's disease, unspecified: Secondary | ICD-10-CM | POA: Diagnosis not present

## 2017-07-01 DIAGNOSIS — D519 Vitamin B12 deficiency anemia, unspecified: Secondary | ICD-10-CM | POA: Diagnosis not present

## 2017-08-03 DIAGNOSIS — J019 Acute sinusitis, unspecified: Secondary | ICD-10-CM | POA: Diagnosis not present

## 2017-08-03 DIAGNOSIS — R05 Cough: Secondary | ICD-10-CM | POA: Diagnosis not present

## 2017-08-03 DIAGNOSIS — D519 Vitamin B12 deficiency anemia, unspecified: Secondary | ICD-10-CM | POA: Diagnosis not present

## 2017-08-03 DIAGNOSIS — I10 Essential (primary) hypertension: Secondary | ICD-10-CM | POA: Diagnosis not present

## 2017-08-27 DIAGNOSIS — H2513 Age-related nuclear cataract, bilateral: Secondary | ICD-10-CM | POA: Diagnosis not present

## 2017-09-01 DIAGNOSIS — D519 Vitamin B12 deficiency anemia, unspecified: Secondary | ICD-10-CM | POA: Diagnosis not present

## 2017-11-23 ENCOUNTER — Other Ambulatory Visit: Payer: Self-pay | Admitting: Internal Medicine

## 2017-11-23 DIAGNOSIS — Z1231 Encounter for screening mammogram for malignant neoplasm of breast: Secondary | ICD-10-CM

## 2017-12-02 ENCOUNTER — Ambulatory Visit: Payer: Self-pay

## 2017-12-03 ENCOUNTER — Ambulatory Visit: Payer: Self-pay

## 2017-12-06 ENCOUNTER — Ambulatory Visit (INDEPENDENT_AMBULATORY_CARE_PROVIDER_SITE_OTHER): Payer: Medicare Other

## 2017-12-06 DIAGNOSIS — E538 Deficiency of other specified B group vitamins: Secondary | ICD-10-CM

## 2017-12-06 MED ORDER — CYANOCOBALAMIN 1000 MCG/ML IJ SOLN
1000.0000 ug | Freq: Once | INTRAMUSCULAR | Status: AC
  Administered 2017-12-06: 1000 ug via INTRAMUSCULAR

## 2017-12-07 ENCOUNTER — Ambulatory Visit: Payer: Self-pay

## 2017-12-22 ENCOUNTER — Ambulatory Visit
Admission: RE | Admit: 2017-12-22 | Discharge: 2017-12-22 | Disposition: A | Discharge status: Home / Self Care | Payer: Medicare Other | Source: Ambulatory Visit | Attending: Internal Medicine | Admitting: Internal Medicine

## 2017-12-22 DIAGNOSIS — Z1231 Encounter for screening mammogram for malignant neoplasm of breast: Secondary | ICD-10-CM | POA: Diagnosis not present

## 2018-01-06 ENCOUNTER — Ambulatory Visit (INDEPENDENT_AMBULATORY_CARE_PROVIDER_SITE_OTHER): Payer: Medicare Other

## 2018-01-06 DIAGNOSIS — E538 Deficiency of other specified B group vitamins: Secondary | ICD-10-CM | POA: Diagnosis not present

## 2018-01-06 MED ORDER — CYANOCOBALAMIN 1000 MCG/ML IJ SOLN
1000.0000 ug | Freq: Once | INTRAMUSCULAR | Status: AC
  Administered 2018-01-06: 1000 ug via INTRAMUSCULAR

## 2018-01-25 ENCOUNTER — Ambulatory Visit: Payer: Medicare Other | Admitting: Nurse Practitioner

## 2018-01-25 ENCOUNTER — Encounter: Payer: Self-pay | Admitting: Nurse Practitioner

## 2018-01-25 VITALS — BP 130/80 | HR 74 | Resp 16 | Ht 62.0 in | Wt 99.0 lb

## 2018-01-25 DIAGNOSIS — E538 Deficiency of other specified B group vitamins: Secondary | ICD-10-CM | POA: Diagnosis not present

## 2018-01-25 DIAGNOSIS — I10 Essential (primary) hypertension: Secondary | ICD-10-CM

## 2018-01-25 DIAGNOSIS — E079 Disorder of thyroid, unspecified: Secondary | ICD-10-CM | POA: Diagnosis not present

## 2018-01-25 DIAGNOSIS — F0390 Unspecified dementia without behavioral disturbance: Secondary | ICD-10-CM | POA: Diagnosis not present

## 2018-01-25 MED ORDER — OLMESARTAN MEDOXOMIL 20 MG PO TABS: 20.0000 mg | ORAL_TABLET | Freq: Every day | ORAL | 3 refills | Status: DC

## 2018-01-25 MED ORDER — CYANOCOBALAMIN 1000 MCG/ML IJ SOLN
1000.0000 ug | Freq: Once | INTRAMUSCULAR | Status: AC
  Administered 2018-01-25: 1000 ug via INTRAMUSCULAR

## 2018-01-25 NOTE — Progress Notes (Signed)
[facility] [place], [place] [address]  Internal MEDICINE  Office Visit Note  Patient Name: Joanna Aguilar  [id number]  [id number]  Date of Service: 02/23/2018  No chief complaint on file.   Hypertension  This is a chronic problem. The current episode started more than 1 year ago. The problem is unchanged. The problem is controlled. Pertinent negatives include no chest pain, neck pain or palpitations. There are no associated agents to hypertension. Risk factors for coronary artery disease include post-menopausal state. Past treatments include ACE inhibitors, angiotensin blockers and calcium channel blockers. The current treatment provides moderate improvement. There are no compliance problems.     Pt is here for routine follow up.    Current Medication: Outpatient Encounter Medications as of 01/25/2018  Medication Sig  . amLODipine (NORVASC) 5 MG tablet Take 5 mg by mouth daily.  . benzonatate (TESSALON) 200 MG capsule Take 200 mg by mouth 3 (three) times daily as needed for cough.  . donepezil (ARICEPT) 10 MG tablet Take 10 mg by mouth at bedtime.  . ibandronate (BONIVA) 150 MG tablet Take 150 mg by mouth every 30 (thirty) days. Take in the morning with a full glass of water, on an empty stomach, and do not take anything else by mouth or lie down for the next 30 min.  . Levothyroxine Sodium (TIROSINT) 88 MCG CAPS Take by mouth daily before breakfast.  . memantine (NAMENDA) 5 MG tablet Take 5 mg by mouth 2 (two) times daily.  . ondansetron (ZOFRAN) 4 MG tablet Take 1 tablet (4 mg total) by mouth daily as needed for nausea or vomiting.  . triamcinolone cream (KENALOG) 0.1 % Apply 1 application topically 2 (two) times daily.  Marland Kitchen olmesartan (BENICAR) 20 MG tablet Take 1 tablet (20 mg total) by mouth daily.  . [EXPIRED] cyanocobalamin ((VITAMIN B-12)) injection 1,000 mcg    No facility-administered encounter medications on file as of 01/25/2018.     Surgical  History: Past Surgical History:  Procedure Laterality Date  . ABDOMINAL HYSTERECTOMY      Medical History: Past Medical History:  Diagnosis Date  . Hypertension   . Thyroid disease     Family History: No family history on file.  Social History   Socioeconomic History  . Marital status: Widowed    Spouse name: Not on file  . Number of children: Not on file  . Years of education: Not on file  . Highest education level: Not on file  Occupational History  . Not on file  Social Needs  . Financial resource strain: Not on file  . Food insecurity:    Worry: Not on file    Inability: Not on file  . Transportation needs:    Medical: Not on file    Non-medical: Not on file  Tobacco Use  . Smoking status: Never Smoker  . Smokeless tobacco: Never Used  Substance and Sexual Activity  . Alcohol use: No  . Drug use: Not on file  . Sexual activity: Not on file  Lifestyle  . Physical activity:    Days per week: Not on file    Minutes per session: Not on file  . Stress: Not on file  Relationships  . Social connections:    Talks on phone: Not on file    Gets together: Not on file    Attends religious service: Not on file    Active member of club or organization: Not on file    Attends  meetings of clubs or organizations: Not on file    Relationship status: Not on file  . Intimate partner violence:    Fear of current or ex partner: Not on file    Emotionally abused: Not on file    Physically abused: Not on file    Forced sexual activity: Not on file  Other Topics Concern  . Not on file  Social History Narrative  . Not on file      Review of Systems  Constitutional: Negative for activity change, chills, fatigue and unexpected weight change.  HENT: Negative for congestion, postnasal drip, rhinorrhea, sneezing and sore throat.   Eyes: Negative.  Negative for redness.  Respiratory: Negative for cough, chest tightness and wheezing.   Cardiovascular: Negative for chest  pain and palpitations.  Gastrointestinal: Negative for abdominal pain, constipation, diarrhea, nausea and vomiting.  Endocrine: Negative for cold intolerance, heat intolerance, polydipsia, polyphagia and polyuria.  Genitourinary: Negative.  Negative for dysuria and frequency.  Musculoskeletal: Negative for arthralgias, back pain, joint swelling and neck pain.  Skin: Negative for rash.  Allergic/Immunologic: Negative for environmental allergies.  Neurological: Negative for tremors and numbness.       Well managed dementia  Hematological: Negative for adenopathy. Does not bruise/bleed easily.  Psychiatric/Behavioral: Negative for agitation, sleep disturbance and suicidal ideas. Behavioral problem: Depression.    Today's Vitals   01/25/18 1223  BP: 130/80  Pulse: 74  Resp: 16  SpO2: 97%  Weight: 99 lb (44.9 kg)  Height: 5\' 2"  (1.575 m)    Physical Exam  Constitutional: She is oriented to person, place, and time. She appears well-developed and well-nourished. No distress.  HENT:  Head: Normocephalic and atraumatic.  Mouth/Throat: Oropharynx is clear and moist. No oropharyngeal exudate.  Eyes: Pupils are equal, round, and reactive to light. EOM are normal.  Neck: Normal range of motion. Neck supple. No JVD present. Carotid bruit is not present. No tracheal deviation present. No thyromegaly present.  Cardiovascular: Normal rate, regular rhythm and normal heart sounds. Exam reveals no gallop and no friction rub.  No murmur heard. Pulmonary/Chest: Effort normal and breath sounds normal. No respiratory distress. She has no wheezes. She has no rales. She exhibits no tenderness.  Abdominal: Soft. Bowel sounds are normal. There is no tenderness.  Musculoskeletal: Normal range of motion.  Lymphadenopathy:    She has no cervical adenopathy.  Neurological: She is alert and oriented to person, place, and time. No cranial nerve deficit.  Patient is at her neurological baseline   Skin: Skin is  warm and dry. She is not diaphoretic.  Psychiatric: She has a normal mood and affect. Her behavior is normal. Judgment and thought content normal.  Nursing note and vitals reviewed.   Assessment/Plan:  1. Essential hypertension Blood pressure well controlled with current medications.  - olmesartan (BENICAR) 20 MG tablet; Take 1 tablet (20 mg total) by mouth daily.  Dispense: 30 tablet; Refill: 3  2. B12 deficiency Vitamin b12 injection administered today - cyanocobalamin ((VITAMIN B-12)) injection 1,000 mcg  3. Thyroid disease Thyroid stable. Continue levothyroxine as prescribed   4. Dementia without behavioral disturbance, unspecified dementia type Continue namenda and aricept as prescribed.   General Counseling: Joanna Aguilar understanding of the findings of todays visit and agrees with plan of treatment. I have discussed any further diagnostic evaluation that may be needed or ordered today. We also reviewed her medications today. she has been encouraged to call the office with any questions or concerns that should arise  related to todays visit.   Hypertension Counseling:   The following hypertensive lifestyle modification were recommended and discussed:  1. Limiting alcohol intake to less than 1 oz/day of ethanol:(24 oz of beer or 8 oz of wine or 2 oz of 100-proof whiskey). 2. Take baby ASA 81 mg daily. 3. Importance of regular aerobic exercise and losing weight. 4. Reduce dietary saturated fat and cholesterol intake for overall cardiovascular health. 5. Maintaining adequate dietary potassium, calcium, and magnesium intake. 6. Regular monitoring of the blood pressure. 7. Reduce sodium intake to less than 100 mmol/day (less than 2.3 gm of sodium or less than 6 gm of sodium choride)   This patient was seen by Leretha Pol, FNP- C in Collaboration with Dr Lavera Guise as a part of collaborative care agreement     Meds ordered this encounter  Medications  .  cyanocobalamin ((VITAMIN B-12)) injection 1,000 mcg  . olmesartan (BENICAR) 20 MG tablet    Sig: Take 1 tablet (20 mg total) by mouth daily.    Dispense:  30 tablet    Refill:  3    D/c losartan due to recall. Start olmesartan    Order Specific Question:   Supervising Provider    Answer:   Lavera Guise [[id number]]    Time spent: 85  Minutes      Dr Lavera Guise Internal medicine

## 2018-01-27 ENCOUNTER — Ambulatory Visit: Payer: Self-pay | Admitting: Nurse Practitioner

## 2018-02-23 DIAGNOSIS — F0390 Unspecified dementia without behavioral disturbance: Secondary | ICD-10-CM | POA: Insufficient documentation

## 2018-02-23 DIAGNOSIS — E079 Disorder of thyroid, unspecified: Secondary | ICD-10-CM | POA: Insufficient documentation

## 2018-02-23 DIAGNOSIS — I10 Essential (primary) hypertension: Secondary | ICD-10-CM | POA: Insufficient documentation

## 2018-02-25 ENCOUNTER — Ambulatory Visit: Payer: Self-pay

## 2018-03-31 ENCOUNTER — Ambulatory Visit (INDEPENDENT_AMBULATORY_CARE_PROVIDER_SITE_OTHER): Payer: Medicare Other

## 2018-03-31 DIAGNOSIS — E538 Deficiency of other specified B group vitamins: Secondary | ICD-10-CM | POA: Diagnosis not present

## 2018-03-31 MED ORDER — CYANOCOBALAMIN 1000 MCG/ML IJ SOLN
1000.0000 ug | Freq: Once | INTRAMUSCULAR | Status: AC
  Administered 2018-03-31: 1000 ug via INTRAMUSCULAR

## 2018-03-31 NOTE — Progress Notes (Signed)
b12

## 2018-04-21 ENCOUNTER — Other Ambulatory Visit: Payer: Self-pay | Admitting: Internal Medicine

## 2018-05-09 ENCOUNTER — Other Ambulatory Visit: Payer: Self-pay

## 2018-05-09 NOTE — Patient Outreach (Signed)
[facility]) Care Management  05/09/2018  Joanna Aguilar 11/25/1940 [id number]   Medication Adherence call to Joanna Aguilar patient did not answer patient is due on Olmesartan 20 mg. Joanna Aguilar is showing past due under [place].   [place] Management Direct Dial [phone]  Fax [phone] [email or web address]

## 2018-05-12 ENCOUNTER — Encounter: Payer: Self-pay | Admitting: Nurse Practitioner

## 2018-05-12 ENCOUNTER — Ambulatory Visit: Payer: Medicare Other | Admitting: Nurse Practitioner

## 2018-05-12 VITALS — BP 146/105 | HR 103 | Resp 16 | Ht 64.0 in | Wt 98.2 lb

## 2018-05-12 DIAGNOSIS — J069 Acute upper respiratory infection, unspecified: Secondary | ICD-10-CM

## 2018-05-12 DIAGNOSIS — R05 Cough: Secondary | ICD-10-CM

## 2018-05-12 DIAGNOSIS — I10 Essential (primary) hypertension: Secondary | ICD-10-CM

## 2018-05-12 DIAGNOSIS — R059 Cough, unspecified: Secondary | ICD-10-CM

## 2018-05-12 MED ORDER — BENZONATATE 200 MG PO CAPS: 200.0000 mg | ORAL_CAPSULE | Freq: Three times a day (TID) | ORAL | 0 refills | Status: DC | PRN

## 2018-05-12 MED ORDER — SULFAMETHOXAZOLE-TRIMETHOPRIM 800-160 MG PO TABS: 1.0000 | ORAL_TABLET | Freq: Two times a day (BID) | ORAL | 0 refills | Status: DC

## 2018-05-12 NOTE — Progress Notes (Signed)
[facility] [place], [place] [address]  Internal MEDICINE  Office Visit Note  Patient Name: Joanna Aguilar  [id number]  [id number]  Date of Service: 05/12/2018  Chief Complaint  Patient presents with  . Cough  . Nasal Congestion    Cough  This is a new problem. The current episode started in the past 7 days. The problem has been unchanged. The problem occurs every few minutes. The cough is non-productive. Associated symptoms include headaches, postnasal drip and a sore throat. Pertinent negatives include no chest pain, chills, ear pain, fever, myalgias, rash, rhinorrhea or wheezing. The symptoms are aggravated by dust and pollens. She has tried nothing for the symptoms. Her past medical history is significant for environmental allergies.    Pt is here for routine follow up.    Current Medication: Outpatient Encounter Medications as of 05/12/2018  Medication Sig  . amLODipine (NORVASC) 5 MG tablet Take 5 mg by mouth daily.  . benzonatate (TESSALON) 200 MG capsule Take 1 capsule (200 mg total) by mouth 3 (three) times daily as needed for cough.  . donepezil (ARICEPT) 10 MG tablet Take 10 mg by mouth at bedtime.  . ibandronate (BONIVA) 150 MG tablet Take 150 mg by mouth every 30 (thirty) days. Take in the morning with a full glass of water, on an empty stomach, and do not take anything else by mouth or lie down for the next 30 min.  . Levothyroxine Sodium (TIROSINT) 88 MCG CAPS Take by mouth daily before breakfast.  . memantine (NAMENDA) 5 MG tablet TAKE 1 TABLET BY MOUTH EVERY DAY FOR MEMORY  . olmesartan (BENICAR) 20 MG tablet Take 1 tablet (20 mg total) by mouth daily.  . ondansetron (ZOFRAN) 4 MG tablet Take 1 tablet (4 mg total) by mouth daily as needed for nausea or vomiting.  . triamcinolone cream (KENALOG) 0.1 % Apply 1 application topically 2 (two) times daily.  . [DISCONTINUED] benzonatate (TESSALON) 200 MG capsule Take 200 mg by mouth 3 (three)  times daily as needed for cough.  . sulfamethoxazole-trimethoprim (BACTRIM DS,SEPTRA DS) 800-160 MG tablet Take 1 tablet by mouth 2 (two) times daily.   No facility-administered encounter medications on file as of 05/12/2018.     Surgical History: Past Surgical History:  Procedure Laterality Date  . ABDOMINAL HYSTERECTOMY      Medical History: Past Medical History:  Diagnosis Date  . Hypertension   . Thyroid disease     Family History: History reviewed. No pertinent family history.  Social History   Socioeconomic History  . Marital status: Widowed    Spouse name: Not on file  . Number of children: Not on file  . Years of education: Not on file  . Highest education level: Not on file  Occupational History  . Not on file  Social Needs  . Financial resource strain: Not on file  . Food insecurity:    Worry: Not on file    Inability: Not on file  . Transportation needs:    Medical: Not on file    Non-medical: Not on file  Tobacco Use  . Smoking status: Never Smoker  . Smokeless tobacco: Never Used  Substance and Sexual Activity  . Alcohol use: No  . Drug use: Not on file  . Sexual activity: Not on file  Lifestyle  . Physical activity:    Days per week: Not on file    Minutes per session: Not on file  . Stress: Not on  file  Relationships  . Social connections:    Talks on phone: Not on file    Gets together: Not on file    Attends religious service: Not on file    Active member of club or organization: Not on file    Attends meetings of clubs or organizations: Not on file    Relationship status: Not on file  . Intimate partner violence:    Fear of current or ex partner: Not on file    Emotionally abused: Not on file    Physically abused: Not on file    Forced sexual activity: Not on file  Other Topics Concern  . Not on file  Social History Narrative  . Not on file      Review of Systems  Constitutional: Negative for chills, fatigue and fever.  HENT:  Positive for congestion, postnasal drip, sore throat and voice change. Negative for ear pain, rhinorrhea and sinus pain.   Eyes: Negative.   Respiratory: Positive for cough. Negative for wheezing.   Cardiovascular: Negative for chest pain and palpitations.  Gastrointestinal: Negative for constipation, diarrhea, nausea and vomiting.  Endocrine: Negative for cold intolerance, heat intolerance, polydipsia, polyphagia and polyuria.  Musculoskeletal: Negative for back pain and myalgias.  Skin: Negative for rash.  Allergic/Immunologic: Positive for environmental allergies.  Neurological: Positive for headaches.  Hematological: Negative for adenopathy.  Psychiatric/Behavioral: Negative.     Today's Vitals   05/12/18 1018  BP: (!) 146/105  Pulse: (!) 103  Resp: 16  SpO2: 97%  Weight: 98 lb 3.2 oz (44.5 kg)  Height: 5\' 4"  (1.626 m)    Physical Exam  Constitutional: She is oriented to person, place, and time. She appears well-developed and well-nourished. No distress.  HENT:  Head: Normocephalic and atraumatic.  Nose: Nose normal.  Mouth/Throat: Mucous membranes are normal. Posterior oropharyngeal erythema present. No oropharyngeal exudate.  Eyes: Pupils are equal, round, and reactive to light. Conjunctivae and EOM are normal.  Neck: Normal range of motion. Neck supple. No JVD present. No tracheal deviation present. No thyromegaly present.  Cardiovascular: Normal rate, regular rhythm and normal heart sounds. Exam reveals no gallop and no friction rub.  No murmur heard. Pulmonary/Chest: Effort normal. No respiratory distress. She has no wheezes. She has no rales. She exhibits no tenderness.  Breath sounds are mildly diminished throughout the lung fields.   Abdominal: Soft. Bowel sounds are normal. There is no tenderness.  Musculoskeletal: Normal range of motion.  Lymphadenopathy:    She has no cervical adenopathy.  Neurological: She is alert and oriented to person, place, and time. No  cranial nerve deficit.  Skin: Skin is warm and dry. She is not diaphoretic.  Psychiatric: She has a normal mood and affect. Her behavior is normal. Judgment and thought content normal.  Nursing note and vitals reviewed.  Assessment/Plan: 1. Acute upper respiratory infection Bactrim DS bid for 10 days. Rest and increase fluids. Use OTC medications to help relieve symptoms. May gargle with warm salt water to relieve sore throat.  - sulfamethoxazole-trimethoprim (BACTRIM DS,SEPTRA DS) 800-160 MG tablet; Take 1 tablet by mouth 2 (two) times daily.  Dispense: 20 tablet; Refill: 0  2. Cough Tessalon perls 200mg  may be taken up to three times daily if needed for cough.  - benzonatate (TESSALON) 200 MG capsule; Take 1 capsule (200 mg total) by mouth 3 (three) times daily as needed for cough.  Dispense: 30 capsule; Refill: 0  3. Essential hypertension Generally stable. Continue bp medication as prescribed.  General Counseling: kasiya burck understanding of the findings of todays visit and agrees with plan of treatment. I have discussed any further diagnostic evaluation that may be needed or ordered today. We also reviewed her medications today. she has been encouraged to call the office with any questions or concerns that should arise related to todays visit.    Counseling:  Rest and increase fluids. Continue using OTC medication to control symptoms.   This patient was seen by Leretha Pol, FNP- C in Collaboration with Dr Lavera Guise as a part of collaborative care agreement    Meds ordered this encounter  Medications  . sulfamethoxazole-trimethoprim (BACTRIM DS,SEPTRA DS) 800-160 MG tablet    Sig: Take 1 tablet by mouth 2 (two) times daily.    Dispense:  20 tablet    Refill:  0    Order Specific Question:   Supervising Provider    Answer:   Lavera Guise [[id number]]  . benzonatate (TESSALON) 200 MG capsule    Sig: Take 1 capsule (200 mg total) by mouth 3 (three) times daily as  needed for cough.    Dispense:  30 capsule    Refill:  0    Order Specific Question:   Supervising Provider    Answer:   Lavera Guise [[id number]]    Time spent: 31 Minutes      Dr Lavera Guise Internal medicine

## 2018-06-01 ENCOUNTER — Encounter: Payer: Self-pay | Admitting: Internal Medicine

## 2018-06-08 ENCOUNTER — Ambulatory Visit: Payer: Medicare Other | Admitting: Adult Health

## 2018-06-08 ENCOUNTER — Encounter: Payer: Self-pay | Admitting: Adult Health

## 2018-06-08 ENCOUNTER — Other Ambulatory Visit: Payer: Self-pay | Admitting: Internal Medicine

## 2018-06-08 DIAGNOSIS — Z0001 Encounter for general adult medical examination with abnormal findings: Secondary | ICD-10-CM | POA: Diagnosis not present

## 2018-06-08 DIAGNOSIS — I10 Essential (primary) hypertension: Secondary | ICD-10-CM

## 2018-06-08 DIAGNOSIS — E538 Deficiency of other specified B group vitamins: Secondary | ICD-10-CM | POA: Diagnosis not present

## 2018-06-08 DIAGNOSIS — R634 Abnormal weight loss: Secondary | ICD-10-CM

## 2018-06-08 DIAGNOSIS — R300 Dysuria: Secondary | ICD-10-CM | POA: Diagnosis not present

## 2018-06-08 DIAGNOSIS — Z1231 Encounter for screening mammogram for malignant neoplasm of breast: Secondary | ICD-10-CM | POA: Diagnosis not present

## 2018-06-08 DIAGNOSIS — M810 Age-related osteoporosis without current pathological fracture: Secondary | ICD-10-CM | POA: Diagnosis not present

## 2018-06-08 DIAGNOSIS — Z1239 Encounter for other screening for malignant neoplasm of breast: Secondary | ICD-10-CM

## 2018-06-08 DIAGNOSIS — E890 Postprocedural hypothyroidism: Secondary | ICD-10-CM

## 2018-06-08 MED ORDER — CYANOCOBALAMIN 1000 MCG/ML IJ SOLN
1000.0000 ug | Freq: Once | INTRAMUSCULAR | Status: AC
  Administered 2018-06-08: 1000 ug via INTRAMUSCULAR

## 2018-06-08 MED ORDER — PNEUMOCOCCAL VAC POLYVALENT 25 MCG/0.5ML IJ INJ: 0.5000 mL | INJECTION | INTRAMUSCULAR | 0 refills | Status: AC

## 2018-06-08 NOTE — Progress Notes (Signed)
[facility] [place], [place] [address]  Internal MEDICINE  Office Visit Note  Patient Name: Joanna Aguilar  [id number]  [id number]  Date of Service: 02/13/2019  Chief Complaint  Patient presents with  . Annual Exam  . Hypertension  . Dementia   HPI Pt is here for routine health maintenance examination. She is here with her daughter, feels well, memory is stable. She has lost 2- 3 lbs since last visit. She does have good appetite   Current Medication: Outpatient Encounter Medications as of 06/08/2018  Medication Sig  . Levothyroxine Sodium (TIROSINT) 88 MCG CAPS Take by mouth daily before breakfast.  . olmesartan (BENICAR) 20 MG tablet Take 1 tablet (20 mg total) by mouth daily.  Marland Kitchen triamcinolone cream (KENALOG) 0.1 % Apply 1 application topically 2 (two) times daily.  . [DISCONTINUED] amLODipine (NORVASC) 5 MG tablet Take 5 mg by mouth daily.  . [DISCONTINUED] benzonatate (TESSALON) 200 MG capsule Take 1 capsule (200 mg total) by mouth 3 (three) times daily as needed for cough.  . [DISCONTINUED] donepezil (ARICEPT) 10 MG tablet Take 10 mg by mouth at bedtime.  . [DISCONTINUED] ibandronate (BONIVA) 150 MG tablet Take 150 mg by mouth every 30 (thirty) days. Take in the morning with a full glass of water, on an empty stomach, and do not take anything else by mouth or lie down for the next 30 min.  . [DISCONTINUED] memantine (NAMENDA) 5 MG tablet TAKE 1 TABLET BY MOUTH EVERY DAY FOR MEMORY  . [DISCONTINUED] ondansetron (ZOFRAN) 4 MG tablet Take 1 tablet (4 mg total) by mouth daily as needed for nausea or vomiting.  . [DISCONTINUED] sulfamethoxazole-trimethoprim (BACTRIM DS,SEPTRA DS) 800-160 MG tablet Take 1 tablet by mouth 2 (two) times daily.  . [EXPIRED] cyanocobalamin ((VITAMIN B-12)) injection 1,000 mcg    No facility-administered encounter medications on file as of 06/08/2018.     Surgical History: Past Surgical History:  Procedure Laterality Date   . ABDOMINAL HYSTERECTOMY      Medical History: Past Medical History:  Diagnosis Date  . Dementia ([facility])   . Hypertension   . Thyroid disease     Family History: History reviewed. No pertinent family history.    Review of Systems  Constitutional: Negative for chills, diaphoresis and fatigue.  HENT: Negative for ear pain, postnasal drip and sinus pressure.   Eyes: Negative for photophobia, discharge, redness, itching and visual disturbance.  Respiratory: Negative for cough, shortness of breath and wheezing.   Cardiovascular: Negative for chest pain, palpitations and leg swelling.  Gastrointestinal: Negative for abdominal pain, constipation, diarrhea, nausea and vomiting.  Genitourinary: Negative for dysuria and flank pain.  Musculoskeletal: Negative for arthralgias, back pain, gait problem and neck pain.  Skin: Negative for color change.  Allergic/Immunologic: Negative for environmental allergies and food allergies.  Neurological: Negative for dizziness and headaches.  Hematological: Does not bruise/bleed easily.  Psychiatric/Behavioral: Negative for agitation, behavioral problems (depression) and hallucinations.     Vital Signs: BP (!) 136/112   Pulse 76   Resp 16   Ht 5\' 5"  (1.651 m)   Wt 88 lb 12.8 oz (40.3 kg)   SpO2 95%   BMI 14.78 kg/m    Physical Exam Constitutional:      General: She is not in acute distress.    Appearance: She is well-developed. She is not diaphoretic.  HENT:     Head: Normocephalic and atraumatic.     Mouth/Throat:     Pharynx: No oropharyngeal exudate.  Eyes:     Pupils: Pupils are equal, round, and reactive to light.  Neck:     Musculoskeletal: Normal range of motion and neck supple.     Thyroid: No thyromegaly.     Vascular: No JVD.     Trachea: No tracheal deviation.  Cardiovascular:     Rate and Rhythm: Normal rate and regular rhythm.     Heart sounds: Normal heart sounds. No murmur. No friction rub. No gallop.   Pulmonary:      Effort: Pulmonary effort is normal. No respiratory distress.     Breath sounds: No wheezing or rales.  Chest:     Chest wall: No tenderness.     Breasts:        Right: Normal.        Left: Normal.  Abdominal:     General: Bowel sounds are normal.     Palpations: Abdomen is soft.  Musculoskeletal: Normal range of motion.  Lymphadenopathy:     Cervical: No cervical adenopathy.  Skin:    General: Skin is warm and dry.  Neurological:     Mental Status: She is alert and oriented to person, place, and time.     Cranial Nerves: No cranial nerve deficit.  Psychiatric:        Behavior: Behavior normal.        Thought Content: Thought content normal.        Judgment: Judgment normal.    LABS: Recent Results (from the past 2160 hour(s))  Basic metabolic panel     Status: Abnormal   Collection Time: 12/11/18  1:57 PM  Result Value Ref Range   Sodium 138 135 - 145 mmol/L   Potassium 3.5 3.5 - 5.1 mmol/L   Chloride 102 98 - 111 mmol/L   CO2 25 22 - 32 mmol/L   Glucose, Bld 145 (H) 70 - 99 mg/dL   BUN 19 8 - 23 mg/dL   Creatinine, Ser 0.91 0.44 - 1.00 mg/dL   Calcium 9.3 8.9 - 10.3 mg/dL   GFR calc non Af Amer >60 >60 mL/min   GFR calc Af Amer >60 >60 mL/min   Anion gap 11 5 - 15    Comment: Performed at [facility], [address] [place], [place] [address]  Hepatic function panel     Status: Abnormal   Collection Time: 12/11/18  1:57 PM  Result Value Ref Range   Total Protein 8.3 (H) 6.5 - 8.1 g/dL   Albumin 4.6 3.5 - 5.0 g/dL   AST 23 15 - 41 U/L   ALT 10 0 - 44 U/L   Alkaline Phosphatase 59 38 - 126 U/L   Total Bilirubin 0.8 0.3 - 1.2 mg/dL   Bilirubin, Direct 0.2 0.0 - 0.2 mg/dL   Indirect Bilirubin 0.6 0.3 - 0.9 mg/dL    Comment: Performed at [facility], [address] [place], [place] [address]  CBC with Differential     Status: Abnormal   Collection Time: 12/11/18  1:57 PM  Result Value Ref Range   WBC 6.5 4.0 - 10.5 K/uL   RBC 5.06 3.87  - 5.11 MIL/uL   Hemoglobin 13.0 12.0 - 15.0 g/dL   HCT 41.5 36.0 - 46.0 %   MCV 82.0 80.0 - 100.0 fL   MCH 25.7 (L) 26.0 - 34.0 pg   MCHC 31.3 30.0 - 36.0 g/dL   RDW 13.8 11.5 - 15.5 %   Platelets 192 150 - 400 K/uL   nRBC  0.0 0.0 - 0.2 %   Neutrophils Relative % 84 %   Neutro Abs 5.5 1.7 - 7.7 K/uL   Lymphocytes Relative 9 %   Lymphs Abs 0.6 (L) 0.7 - 4.0 K/uL   Monocytes Relative 6 %   Monocytes Absolute 0.4 0.1 - 1.0 K/uL   Eosinophils Relative 0 %   Eosinophils Absolute 0.0 0.0 - 0.5 K/uL   Basophils Relative 0 %   Basophils Absolute 0.0 0.0 - 0.1 K/uL   Immature Granulocytes 1 %   Abs Immature Granulocytes 0.04 0.00 - 0.07 K/uL    Comment: Performed at [facility], [address] [place], [place] [address]  Lipase, blood     Status: None   Collection Time: 12/11/18  1:57 PM  Result Value Ref Range   Lipase 44 11 - 51 U/L    Comment: Performed at [facility], [address] [place], [place] [address]  Troponin I - Add-On to previous collection     Status: None   Collection Time: 12/11/18  1:57 PM  Result Value Ref Range   Troponin I <0.03 <0.03 ng/mL    Comment: Performed at [facility], [address] [place], [place] [address]  Urinalysis, Complete w Microscopic     Status: Abnormal   Collection Time: 12/11/18  2:18 PM  Result Value Ref Range   Color, Urine STRAW (A) YELLOW   APPearance CLEAR (A) CLEAR   Specific Gravity, Urine 1.009 1.005 - 1.030   pH 7.0 5.0 - 8.0   Glucose, UA NEGATIVE NEGATIVE mg/dL   Hgb urine dipstick NEGATIVE NEGATIVE   Bilirubin Urine NEGATIVE NEGATIVE   Ketones, ur 5 (A) NEGATIVE mg/dL   Protein, ur NEGATIVE NEGATIVE mg/dL   Nitrite NEGATIVE NEGATIVE   Leukocytes, UA NEGATIVE NEGATIVE   RBC / HPF 0-5 0 - 5 RBC/hpf   WBC, UA 0-5 0 - 5 WBC/hpf   Bacteria, UA NONE SEEN NONE SEEN   Squamous Epithelial / LPF NONE SEEN 0 - 5   Mucus PRESENT     Comment: Performed at [facility], [address] [place], [place] [address]  Lactic acid, plasma     Status: None   Collection Time: 12/11/18  3:27 PM  Result Value Ref Range   Lactic Acid, Venous 1.3 0.5 - 1.9 mmol/L    Comment: Performed at [facility], [address] [place], [place] [address]  Troponin I - Once-Timed     Status: None   Collection Time: 12/11/18  6:28 PM  Result Value Ref Range   Troponin I <0.03 <0.03 ng/mL    Comment: Performed at [facility], [address], [place], [place] [address]   Assessment/Plan: 1. Encounter for general adult medical examination with abnormal findings - Continue all meds as before  - Urine Culture, Reflex  2. Dysuria - UA/M w/rflx Culture, Routine - Microscopic Examination - Urine Culture, Reflex  3. B12 deficiency - cyanocobalamin ((VITAMIN B-12)) injection 1,000 mcg  4. Senile osteoporosis - DG Bone Density; Future  5. Screening breast examination - mammogram is ordered   6. Essential hypertension - Continue meds, repeat BP 132/88  7. Postablative hypothyroidism - Continue Synthroid as before   8. Weight loss, unintentional - Encouraged increased po intake, supplement with Ensure   General Counseling: moe brier understanding of the findings of todays visit and agrees with plan of treatment. I have discussed any further diagnostic evaluation that may be needed or ordered today.  We also reviewed her medications today. she has been encouraged to call the office with any questions or concerns that should arise related to todays visit.   Orders Placed This Encounter  Procedures  . Microscopic Examination  . Urine Culture, Reflex  . DG Bone Density  . UA/M w/rflx Culture, Routine    Meds ordered this encounter  Medications  . cyanocobalamin ((VITAMIN B-12)) injection 1,000 mcg    Time spent:25 Minutes   Lavera Guise, MD  Internal Medicine

## 2018-06-10 ENCOUNTER — Other Ambulatory Visit: Payer: Self-pay | Admitting: Internal Medicine

## 2018-06-10 LAB — UA/M W/RFLX CULTURE, ROUTINE
Bilirubin, UA: NEGATIVE
Glucose, UA: NEGATIVE
Nitrite, UA: NEGATIVE
RBC, UA: NEGATIVE
Specific Gravity, UA: 1.017 (ref 1.005–1.030)
Urobilinogen, Ur: 0.2 mg/dL (ref 0.2–1.0)
pH, UA: 5.5 (ref 5.0–7.5)

## 2018-06-10 LAB — MICROSCOPIC EXAMINATION: Casts: NONE SEEN /lpf

## 2018-06-10 LAB — URINE CULTURE, REFLEX

## 2018-06-10 MED ORDER — CIPROFLOXACIN HCL 500 MG PO TABS: 500.0000 mg | ORAL_TABLET | Freq: Two times a day (BID) | ORAL | 0 refills | Status: DC

## 2018-06-15 ENCOUNTER — Other Ambulatory Visit: Payer: Self-pay | Admitting: Adult Health

## 2018-06-15 DIAGNOSIS — E890 Postprocedural hypothyroidism: Secondary | ICD-10-CM | POA: Diagnosis not present

## 2018-06-15 DIAGNOSIS — Z0001 Encounter for general adult medical examination with abnormal findings: Secondary | ICD-10-CM | POA: Diagnosis not present

## 2018-06-15 DIAGNOSIS — I10 Essential (primary) hypertension: Secondary | ICD-10-CM | POA: Diagnosis not present

## 2018-06-15 DIAGNOSIS — E785 Hyperlipidemia, unspecified: Secondary | ICD-10-CM | POA: Diagnosis not present

## 2018-06-15 DIAGNOSIS — R600 Localized edema: Secondary | ICD-10-CM | POA: Diagnosis not present

## 2018-06-15 DIAGNOSIS — E0500 Thyrotoxicosis with diffuse goiter without thyrotoxic crisis or storm: Secondary | ICD-10-CM | POA: Diagnosis not present

## 2018-06-16 LAB — CBC WITH DIFFERENTIAL/PLATELET
Basophils Absolute: 0 10*3/uL (ref 0.0–0.2)
Basos: 1 %
EOS (ABSOLUTE): 0 10*3/uL (ref 0.0–0.4)
Eos: 0 %
Hematocrit: 32.8 % — ABNORMAL LOW (ref 34.0–46.6)
Hemoglobin: 10.5 g/dL — ABNORMAL LOW (ref 11.1–15.9)
Immature Grans (Abs): 0 10*3/uL (ref 0.0–0.1)
Immature Granulocytes: 0 %
Lymphocytes Absolute: 0.8 10*3/uL (ref 0.7–3.1)
Lymphs: 26 %
MCH: 25.9 pg — ABNORMAL LOW (ref 26.6–33.0)
MCHC: 32 g/dL (ref 31.5–35.7)
MCV: 81 fL (ref 79–97)
Monocytes Absolute: 0.3 10*3/uL (ref 0.1–0.9)
Monocytes: 10 %
Neutrophils Absolute: 2.1 10*3/uL (ref 1.4–7.0)
Neutrophils: 63 %
Platelets: 144 10*3/uL — ABNORMAL LOW (ref 150–450)
RBC: 4.06 x10E6/uL (ref 3.77–5.28)
RDW: 13.8 % (ref 12.3–15.4)
WBC: 3.3 10*3/uL — ABNORMAL LOW (ref 3.4–10.8)

## 2018-06-16 LAB — COMPREHENSIVE METABOLIC PANEL
ALT: 9 IU/L (ref 0–32)
AST: 21 IU/L (ref 0–40)
Albumin/Globulin Ratio: 1.3 (ref 1.2–2.2)
Albumin: 4.1 g/dL (ref 3.5–4.8)
Alkaline Phosphatase: 58 IU/L (ref 39–117)
BUN/Creatinine Ratio: 11 — ABNORMAL LOW (ref 12–28)
BUN: 14 mg/dL (ref 8–27)
Bilirubin Total: 0.5 mg/dL (ref 0.0–1.2)
CO2: 27 mmol/L (ref 20–29)
Calcium: 9.2 mg/dL (ref 8.7–10.3)
Chloride: 100 mmol/L (ref 96–106)
Creatinine, Ser: 1.25 mg/dL — ABNORMAL HIGH (ref 0.57–1.00)
GFR calc Af Amer: 49 mL/min/{1.73_m2} — ABNORMAL LOW (ref >59)
GFR calc non Af Amer: 42 mL/min/{1.73_m2} — ABNORMAL LOW (ref >59)
Globulin, Total: 3.2 g/dL (ref 1.5–4.5)
Glucose: 76 mg/dL (ref 65–99)
Potassium: 3.9 mmol/L (ref 3.5–5.2)
Sodium: 143 mmol/L (ref 134–144)
Total Protein: 7.3 g/dL (ref 6.0–8.5)

## 2018-06-16 LAB — LIPID PANEL WITH LDL/HDL RATIO
Cholesterol, Total: 185 mg/dL (ref 100–199)
HDL: 68 mg/dL (ref >39)
LDL Calculated: 95 mg/dL (ref 0–99)
LDl/HDL Ratio: 1.4 ratio (ref 0.0–3.2)
Triglycerides: 109 mg/dL (ref 0–149)
VLDL Cholesterol Cal: 22 mg/dL (ref 5–40)

## 2018-06-20 ENCOUNTER — Encounter: Payer: Self-pay | Admitting: Adult Health

## 2018-06-22 DIAGNOSIS — I10 Essential (primary) hypertension: Secondary | ICD-10-CM | POA: Diagnosis not present

## 2018-06-22 DIAGNOSIS — R600 Localized edema: Secondary | ICD-10-CM | POA: Diagnosis not present

## 2018-06-22 DIAGNOSIS — M818 Other osteoporosis without current pathological fracture: Secondary | ICD-10-CM | POA: Diagnosis not present

## 2018-06-22 DIAGNOSIS — E890 Postprocedural hypothyroidism: Secondary | ICD-10-CM | POA: Diagnosis not present

## 2018-06-30 ENCOUNTER — Other Ambulatory Visit: Payer: Self-pay | Admitting: Internal Medicine

## 2018-06-30 ENCOUNTER — Telehealth: Payer: Self-pay

## 2018-06-30 NOTE — Telephone Encounter (Signed)
Pt daughter called for rx fill on amlodipine for pt. Sent it to [facility] on [address]

## 2018-07-25 DIAGNOSIS — E0500 Thyrotoxicosis with diffuse goiter without thyrotoxic crisis or storm: Secondary | ICD-10-CM | POA: Diagnosis not present

## 2018-07-25 DIAGNOSIS — E890 Postprocedural hypothyroidism: Secondary | ICD-10-CM | POA: Diagnosis not present

## 2018-08-01 DIAGNOSIS — I10 Essential (primary) hypertension: Secondary | ICD-10-CM | POA: Diagnosis not present

## 2018-08-01 DIAGNOSIS — R600 Localized edema: Secondary | ICD-10-CM | POA: Diagnosis not present

## 2018-08-01 DIAGNOSIS — M818 Other osteoporosis without current pathological fracture: Secondary | ICD-10-CM | POA: Diagnosis not present

## 2018-08-01 DIAGNOSIS — E890 Postprocedural hypothyroidism: Secondary | ICD-10-CM | POA: Diagnosis not present

## 2018-08-08 ENCOUNTER — Encounter: Payer: Self-pay | Admitting: Adult Health

## 2018-08-08 ENCOUNTER — Ambulatory Visit: Payer: Medicare Other | Admitting: Adult Health

## 2018-08-08 VITALS — BP 120/80 | HR 80 | Resp 16 | Ht 64.0 in | Wt 98.4 lb

## 2018-08-08 DIAGNOSIS — E538 Deficiency of other specified B group vitamins: Secondary | ICD-10-CM

## 2018-08-08 DIAGNOSIS — E079 Disorder of thyroid, unspecified: Secondary | ICD-10-CM

## 2018-08-08 DIAGNOSIS — R634 Abnormal weight loss: Secondary | ICD-10-CM | POA: Diagnosis not present

## 2018-08-08 DIAGNOSIS — I10 Essential (primary) hypertension: Secondary | ICD-10-CM

## 2018-08-08 MED ORDER — CYANOCOBALAMIN 1000 MCG/ML IJ SOLN
1000.0000 ug | Freq: Once | INTRAMUSCULAR | Status: AC
  Administered 2018-08-08: 1000 ug via INTRAMUSCULAR

## 2018-08-08 NOTE — Patient Instructions (Signed)
Hypothyroidism Hypothyroidism is a disorder of the thyroid. The thyroid is a large gland that is located in the lower front of the neck. The thyroid releases hormones that control how the body works. With hypothyroidism, the thyroid does not make enough of these hormones. What are the causes? Causes of hypothyroidism may include:  Viral infections.  Pregnancy.  Your own defense system (immune system) attacking your thyroid.  Certain medicines.  Birth defects.  Past radiation treatments to your head or neck.  Past treatment with radioactive iodine.  Past surgical removal of part or all of your thyroid.  Problems with the gland that is located in the center of your brain (pituitary).  What are the signs or symptoms? Signs and symptoms of hypothyroidism may include:  Feeling as though you have no energy (lethargy).  Inability to tolerate cold.  Weight gain that is not explained by a change in diet or exercise habits.  Dry skin.  Coarse hair.  Menstrual irregularity.  Slowing of thought processes.  Constipation.  Sadness or depression.  How is this diagnosed? Your health care provider may diagnose hypothyroidism with blood tests and ultrasound tests. How is this treated? Hypothyroidism is treated with medicine that replaces the hormones that your body does not make. After you begin treatment, it may take several weeks for symptoms to go away. Follow these instructions at home:  Take medicines only as directed by your health care provider.  If you start taking any new medicines, tell your health care provider.  Keep all follow-up visits as directed by your health care provider. This is important. As your condition improves, your dosage needs may change. You will need to have blood tests regularly so that your health care provider can watch your condition. Contact a health care provider if:  Your symptoms do not get better with treatment.  You are taking thyroid  replacement medicine and: ? You sweat excessively. ? You have tremors. ? You feel anxious. ? You lose weight rapidly. ? You cannot tolerate heat. ? You have emotional swings. ? You have diarrhea. ? You feel weak. Get help right away if:  You develop chest pain.  You develop an irregular heartbeat.  You develop a rapid heartbeat. This information is not intended to replace advice given to you by your health care provider. Make sure you discuss any questions you have with your health care provider. Document Released: 11/02/2005 Document Revised: 04/09/2016 Document Reviewed: 03/20/2014 Elsevier Interactive Patient Education  2018 [employer]  

## 2018-08-08 NOTE — Progress Notes (Signed)
[facility] [place], [place] [address]  Internal MEDICINE  Office Visit Note  Patient Name: Joanna Aguilar  [id number]  [id number]  Date of Service: 08/11/2018  Chief Complaint  Patient presents with  . Hypertension  . Hypothyroidism    HPI Pt here for follow up on HTN and Hypothyroid. She has gained 10 pounds in the last 2 months.  She has seen endocrinology, and they hypothesized that the patient missed a few doses of her medicine, and could have caused her thyroid to "act up."  She reports they found her Thyroid levels to have normalized.  She reports her appetite has returned, and she is no longer waking up at night sweating.  She denies any complaints at this time.  Her blood pressure today is well controlled.      Current Medication: Outpatient Encounter Medications as of 08/08/2018  Medication Sig  . amLODipine (NORVASC) 5 MG tablet TAKE 1 TABLET BY MOUTH EVERY MORNING FOR HTN  . ciprofloxacin (CIPRO) 500 MG tablet Take 1 tablet (500 mg total) by mouth 2 (two) times daily.  Marland Kitchen donepezil (ARICEPT) 10 MG tablet Take 10 mg by mouth at bedtime.  . Levothyroxine Sodium (TIROSINT) 88 MCG CAPS Take by mouth daily before breakfast.  . memantine (NAMENDA) 5 MG tablet TAKE 1 TABLET BY MOUTH EVERY DAY FOR MEMORY  . olmesartan (BENICAR) 20 MG tablet Take 1 tablet (20 mg total) by mouth daily.  . ondansetron (ZOFRAN) 4 MG tablet Take 1 tablet (4 mg total) by mouth daily as needed for nausea or vomiting.  . triamcinolone cream (KENALOG) 0.1 % Apply 1 application topically 2 (two) times daily.  . [EXPIRED] cyanocobalamin ((VITAMIN B-12)) injection 1,000 mcg    No facility-administered encounter medications on file as of 08/08/2018.    Surgical History: Past Surgical History:  Procedure Laterality Date  . ABDOMINAL HYSTERECTOMY      Medical History: Past Medical History:  Diagnosis Date  . Dementia   . Hypertension   . Thyroid disease     Family  History: History reviewed. No pertinent family history.  Social History   Socioeconomic History  . Marital status: Widowed    Spouse name: Not on file  . Number of children: Not on file  . Years of education: Not on file  . Highest education level: Not on file  Occupational History  . Not on file  Social Needs  . Financial resource strain: Not on file  . Food insecurity:    Worry: Not on file    Inability: Not on file  . Transportation needs:    Medical: Not on file    Non-medical: Not on file  Tobacco Use  . Smoking status: Never Smoker  . Smokeless tobacco: Never Used  Substance and Sexual Activity  . Alcohol use: No  . Drug use: Not on file  . Sexual activity: Not on file  Lifestyle  . Physical activity:    Days per week: Not on file    Minutes per session: Not on file  . Stress: Not on file  Relationships  . Social connections:    Talks on phone: Not on file    Gets together: Not on file    Attends religious service: Not on file    Active member of club or organization: Not on file    Attends meetings of clubs or organizations: Not on file    Relationship status: Not on file  . Intimate partner violence:  Fear of current or ex partner: Not on file    Emotionally abused: Not on file    Physically abused: Not on file    Forced sexual activity: Not on file  Other Topics Concern  . Not on file  Social History Narrative  . Not on file   Review of Systems  Constitutional: Negative for chills, fatigue and unexpected weight change.  HENT: Negative for congestion, rhinorrhea, sneezing and sore throat.   Eyes: Negative for photophobia, pain and redness.  Respiratory: Negative for cough, chest tightness and shortness of breath.   Cardiovascular: Negative for chest pain and palpitations.  Gastrointestinal: Negative for abdominal pain, constipation, diarrhea, nausea and vomiting.  Endocrine: Negative.   Genitourinary: Negative for dysuria and frequency.   Musculoskeletal: Negative for arthralgias, back pain, joint swelling and neck pain.  Skin: Negative for rash.  Allergic/Immunologic: Negative.   Neurological: Negative for tremors and numbness.  Hematological: Negative for adenopathy. Does not bruise/bleed easily.  Psychiatric/Behavioral: Negative for behavioral problems and sleep disturbance. The patient is not nervous/anxious.    Vital Signs: BP 120/80   Pulse 80   Resp 16   Ht 5\' 4"  (1.626 m)   Wt 98 lb 6.4 oz (44.6 kg)   SpO2 98%   BMI 16.89 kg/m    Physical Exam  Constitutional: She is oriented to person, place, and time. She appears well-developed and well-nourished. No distress.  HENT:  Head: Normocephalic and atraumatic.  Mouth/Throat: Oropharynx is clear and moist. No oropharyngeal exudate.  Eyes: Pupils are equal, round, and reactive to light. EOM are normal.  Neck: Normal range of motion. Neck supple. No JVD present. No tracheal deviation present. No thyromegaly present.  Cardiovascular: Normal rate, regular rhythm and normal heart sounds. Exam reveals no gallop and no friction rub.  No murmur heard. Pulmonary/Chest: Effort normal and breath sounds normal. No respiratory distress. She has no wheezes. She has no rales. She exhibits no tenderness.  Abdominal: Soft. There is no tenderness. There is no guarding.  Musculoskeletal: Normal range of motion.  Lymphadenopathy:    She has no cervical adenopathy.  Neurological: She is alert and oriented to person, place, and time. No cranial nerve deficit.  Skin: Skin is warm and dry. She is not diaphoretic.  Psychiatric: She has a normal mood and affect. Her behavior is normal. Judgment and thought content normal.  Nursing note and vitals reviewed.  Assessment/Plan: 1. Essential hypertension Stable, continue amlodipine.   2. Thyroid disease Stable, sees endocrinology.   3. Weight loss, unintentional Appears to be resolving, pt has gained 10 pounds.   4. B12  deficiency - cyanocobalamin ((VITAMIN B-12)) injection 1,000 mcg  General Counseling: milton streicher understanding of the findings of todays visit and agrees with plan of treatment. I have discussed any further diagnostic evaluation that may be needed or ordered today. We also reviewed her medications today. she has been encouraged to call the office with any questions or concerns that should arise related to todays visit.   Meds ordered this encounter  Medications  . cyanocobalamin ((VITAMIN B-12)) injection 1,000 mcg    Time spent: 25 Minutes   This patient was seen by Orson Gear AGNP-C in Collaboration with Dr Lavera Guise as a part of collaborative care agreement    Dr Lavera Guise Internal medicine

## 2018-11-01 ENCOUNTER — Other Ambulatory Visit: Payer: Self-pay | Admitting: Internal Medicine

## 2018-11-14 ENCOUNTER — Ambulatory Visit (INDEPENDENT_AMBULATORY_CARE_PROVIDER_SITE_OTHER): Payer: Medicare Other

## 2018-11-14 DIAGNOSIS — E538 Deficiency of other specified B group vitamins: Secondary | ICD-10-CM

## 2018-11-14 MED ORDER — CYANOCOBALAMIN 1000 MCG/ML IJ SOLN
1000.0000 ug | Freq: Once | INTRAMUSCULAR | Status: AC
  Administered 2018-11-14: 1000 ug via INTRAMUSCULAR

## 2018-11-22 ENCOUNTER — Other Ambulatory Visit: Payer: Self-pay | Admitting: Internal Medicine

## 2018-11-22 DIAGNOSIS — Z1231 Encounter for screening mammogram for malignant neoplasm of breast: Secondary | ICD-10-CM

## 2018-12-11 ENCOUNTER — Emergency Department: Payer: Medicare Other

## 2018-12-11 ENCOUNTER — Encounter: Payer: Self-pay | Admitting: *Deleted

## 2018-12-11 ENCOUNTER — Emergency Department
Admission: EM | Admit: 2018-12-11 | Discharge: 2018-12-11 | Disposition: A | Discharge status: Home / Self Care | Payer: Medicare Other | Attending: Emergency Medicine | Admitting: Emergency Medicine

## 2018-12-11 ENCOUNTER — Other Ambulatory Visit: Payer: Self-pay

## 2018-12-11 DIAGNOSIS — R42 Dizziness and giddiness: Secondary | ICD-10-CM | POA: Diagnosis not present

## 2018-12-11 DIAGNOSIS — R112 Nausea with vomiting, unspecified: Secondary | ICD-10-CM | POA: Diagnosis not present

## 2018-12-11 DIAGNOSIS — K529 Noninfective gastroenteritis and colitis, unspecified: Secondary | ICD-10-CM | POA: Insufficient documentation

## 2018-12-11 DIAGNOSIS — R0602 Shortness of breath: Secondary | ICD-10-CM | POA: Diagnosis not present

## 2018-12-11 DIAGNOSIS — I10 Essential (primary) hypertension: Secondary | ICD-10-CM | POA: Insufficient documentation

## 2018-12-11 DIAGNOSIS — F0390 Unspecified dementia without behavioral disturbance: Secondary | ICD-10-CM | POA: Insufficient documentation

## 2018-12-11 DIAGNOSIS — I4581 Long QT syndrome: Secondary | ICD-10-CM | POA: Diagnosis not present

## 2018-12-11 DIAGNOSIS — R450 Nervousness: Secondary | ICD-10-CM | POA: Diagnosis not present

## 2018-12-11 DIAGNOSIS — K8689 Other specified diseases of pancreas: Secondary | ICD-10-CM | POA: Insufficient documentation

## 2018-12-11 DIAGNOSIS — Z79899 Other long term (current) drug therapy: Secondary | ICD-10-CM | POA: Diagnosis not present

## 2018-12-11 DIAGNOSIS — R1909 Other intra-abdominal and pelvic swelling, mass and lump: Secondary | ICD-10-CM | POA: Diagnosis not present

## 2018-12-11 LAB — URINALYSIS, COMPLETE (UACMP) WITH MICROSCOPIC
Bacteria, UA: NONE SEEN
Bilirubin Urine: NEGATIVE
Glucose, UA: NEGATIVE mg/dL
Hgb urine dipstick: NEGATIVE
Ketones, ur: 5 mg/dL — AB
Leukocytes, UA: NEGATIVE
Nitrite: NEGATIVE
Protein, ur: NEGATIVE mg/dL
Specific Gravity, Urine: 1.009 (ref 1.005–1.030)
Squamous Epithelial / HPF: NONE SEEN (ref 0–5)
pH: 7 (ref 5.0–8.0)

## 2018-12-11 LAB — CBC WITH DIFFERENTIAL/PLATELET
Abs Immature Granulocytes: 0.04 10*3/uL (ref 0.00–0.07)
Basophils Absolute: 0 10*3/uL (ref 0.0–0.1)
Basophils Relative: 0 %
Eosinophils Absolute: 0 10*3/uL (ref 0.0–0.5)
Eosinophils Relative: 0 %
HCT: 41.5 % (ref 36.0–46.0)
Hemoglobin: 13 g/dL (ref 12.0–15.0)
Immature Granulocytes: 1 %
Lymphocytes Relative: 9 %
Lymphs Abs: 0.6 10*3/uL — ABNORMAL LOW (ref 0.7–4.0)
MCH: 25.7 pg — ABNORMAL LOW (ref 26.0–34.0)
MCHC: 31.3 g/dL (ref 30.0–36.0)
MCV: 82 fL (ref 80.0–100.0)
Monocytes Absolute: 0.4 10*3/uL (ref 0.1–1.0)
Monocytes Relative: 6 %
Neutro Abs: 5.5 10*3/uL (ref 1.7–7.7)
Neutrophils Relative %: 84 %
Platelets: 192 10*3/uL (ref 150–400)
RBC: 5.06 MIL/uL (ref 3.87–5.11)
RDW: 13.8 % (ref 11.5–15.5)
WBC: 6.5 10*3/uL (ref 4.0–10.5)
nRBC: 0 % (ref 0.0–0.2)

## 2018-12-11 LAB — BASIC METABOLIC PANEL
Anion gap: 11 (ref 5–15)
BUN: 19 mg/dL (ref 8–23)
CO2: 25 mmol/L (ref 22–32)
Calcium: 9.3 mg/dL (ref 8.9–10.3)
Chloride: 102 mmol/L (ref 98–111)
Creatinine, Ser: 0.91 mg/dL (ref 0.44–1.00)
GFR calc Af Amer: >60 mL/min (ref >60)
GFR calc non Af Amer: >60 mL/min (ref >60)
Glucose, Bld: 145 mg/dL — ABNORMAL HIGH (ref 70–99)
Potassium: 3.5 mmol/L (ref 3.5–5.1)
Sodium: 138 mmol/L (ref 135–145)

## 2018-12-11 LAB — HEPATIC FUNCTION PANEL
ALT: 10 U/L (ref 0–44)
AST: 23 U/L (ref 15–41)
Albumin: 4.6 g/dL (ref 3.5–5.0)
Alkaline Phosphatase: 59 U/L (ref 38–126)
Bilirubin, Direct: 0.2 mg/dL (ref 0.0–0.2)
Indirect Bilirubin: 0.6 mg/dL (ref 0.3–0.9)
Total Bilirubin: 0.8 mg/dL (ref 0.3–1.2)
Total Protein: 8.3 g/dL — ABNORMAL HIGH (ref 6.5–8.1)

## 2018-12-11 LAB — TROPONIN I
Troponin I: <0.03 ng/mL (ref <0.03)
Troponin I: <0.03 ng/mL (ref <0.03)

## 2018-12-11 LAB — LIPASE, BLOOD: Lipase: 44 U/L (ref 11–51)

## 2018-12-11 LAB — LACTIC ACID, PLASMA: Lactic Acid, Venous: 1.3 mmol/L (ref 0.5–1.9)

## 2018-12-11 MED ORDER — ONDANSETRON 4 MG PO TBDP: 4.0000 mg | ORAL_TABLET | Freq: Three times a day (TID) | ORAL | 0 refills | Status: DC | PRN

## 2018-12-11 MED ORDER — ONDANSETRON HCL 4 MG/2ML IJ SOLN
4.0000 mg | Freq: Once | INTRAMUSCULAR | Status: AC
  Administered 2018-12-11: 4 mg via INTRAVENOUS
  Filled 2018-12-11: qty 2

## 2018-12-11 MED ORDER — SODIUM CHLORIDE 0.9 % IV BOLUS
500.0000 mL | Freq: Once | INTRAVENOUS | Status: AC
  Administered 2018-12-11: 500 mL via INTRAVENOUS

## 2018-12-11 MED ORDER — IOHEXOL 300 MG/ML  SOLN
75.0000 mL | Freq: Once | INTRAMUSCULAR | Status: AC | PRN
  Administered 2018-12-11: 75 mL via INTRAVENOUS

## 2018-12-11 NOTE — ED Notes (Signed)
Patient ambulated to bathroom with a steady gait.

## 2018-12-11 NOTE — Discharge Instructions (Addendum)
As I explained to you your CAT scan showed a small mass in your pancreas.  Make sure to follow-up with your primary care doctor within a week for further evaluation to rule out cancer.  Take Zofran as needed for nausea at home.  If you have abdominal pain or fever please return to the emergency room immediately for reevaluation.

## 2018-12-11 NOTE — ED Provider Notes (Signed)
----------------------------------------- 5:59 PM on 12/11/2018 -----------------------------------------   Blood pressure 118/82, pulse 94, temperature 97.8 F (36.6 C), temperature source Oral, resp. rate (!) 23, height 5\' 2"  (1.575 m), weight 44.5 kg, SpO2 98 %.  Assuming care from Dr. Cherylann Banas of [facility] is a 78 y.o. female with a chief complaint of Nausea .    Please refer to H&P by previous MD for further details.  The current plan of care is to f/u CT and reassess.   I have personally reviewed the images performed during this visit and I agree with the Radiologist's read.   Interpretation by Radiologist:  Dg Chest 2 View  Result Date: 12/11/2018 CLINICAL DATA:  Short of breath. EXAM: CHEST - 2 VIEW COMPARISON:  05/06/2015 FINDINGS: The heart size appears within normal limits. No pleural effusion or edema. No airspace opacities identified. The visualized osseous structures are unremarkable. IMPRESSION: 1. No acute cardiopulmonary abnormalities. Electronically Signed   By: Kerby Moors M.D.   On: 12/11/2018 14:15   Ct Abdomen Pelvis W Contrast  Result Date: 12/11/2018 CLINICAL DATA:  Dementia, acute abdominal pain. EXAM: CT ABDOMEN AND PELVIS WITH CONTRAST TECHNIQUE: Multidetector CT imaging of the abdomen and pelvis was performed using the standard protocol following bolus administration of intravenous contrast. CONTRAST:  24mL OMNIPAQUE IOHEXOL 300 MG/ML  SOLN COMPARISON:  Abdominal ultrasound 06/16/2015 FINDINGS: Lower chest: Unremarkable Hepatobiliary: Unremarkable Pancreas: Mildly prominent dorsal pancreatic duct. 0.8 by 0.6 cm hypodense lesion in the tail the pancreas on image 9/7 Spleen: Unremarkable Adrenals/Urinary Tract: Mild fullness of the left adrenal gland without discrete mass. No significant renal abnormality. Stomach/Bowel: There is a 2.5 by 2.0 by 2.3 cm collection of gas in what appears to be formed stool in the left abdomen on image 41/2 which is  difficult to localize to bowel. I suspect that this is in a large diverticulum of the transverse colon which is otherwise decompressed and which runs along the anterior inferior margin of this collection. It could be in a small bowel diverticulum. However, there is also a tiny locule of gas adjacent to this larger collection on image 31/5 which is likewise difficult to locate in bowel, but which could be in a separate small bowel diverticulum. The alternative is that these represent abscesses, although the lack of a thick enhancing wall would seem to argue against abscess. Vascular/Lymphatic: Aortoiliac atherosclerotic vascular disease. No pathologic adenopathy identified. Reproductive: Uterus absent.  Adnexa unremarkable. Other: There is diffuse mesenteric stranding, cause uncertain. No overt ascites. Musculoskeletal: Lumbar spondylosis and degenerative disc disease with disc bulge causing mild central narrowing of the thecal sac at the L4-5 level. 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 IMPRESSION: 1. In the left abdomen, there is a 2.5 cm collection of gas formed stool like material which is likely in a bowel diverticulum (either of the small bowel or transverse colon). There is a chance that this represents extraluminal gas and stool, but this is considered unlikely given the lack of a thick-walled margin which would normally be present in the setting of an abscess. There is also an adjacent small locule of gas which is probably in a small bowel diverticulum. 2. Diffuse mesenteric edema, etiology is uncertain. 3. 0.8 by 0.6 cm hypodense lesion in the tail the pancreas, with mild prominence of the dorsal pancreatic duct. The appearance is not entirely specific given the very small size of the lesion, although possibilities include a postinflammatory cyst or small intraductal papillary mucinous neoplasm. Follow up pancreatic  protocol CT or MRI in 2 years time is recommended. Given the patient's dementia,  she might tolerate CT better than MRI. This recommendation follows ACR consensus guidelines: Management of Incidental Pancreatic Cysts: A White Paper of the ACR Incidental Findings Committee. J Am Coll Radiol 1937;90:240-973. 4. Mild central stenosis at L4-5 due to disc bulge. 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 Electronically Signed   By: Van Clines M.D.   On: 12/11/2018 16:39     CT read as above. I consulted Dr. Peyton Najjar and spoke directly with the radiologist Dr. Janeece Fitting and they both reviewed the CT and together with no abdominal pain, no tenderness, no white count, no lactic acidosis they believe that the findings on the CT are most likely a diverticulum in the bowel and not perforation.  There is no free air, no free fluid inside these loculations which he would see on an abscess, there is no surrounding inflammation or fat stranding.  I discussed with the radiologist about maybe giving her rectal contrast but he feels that these are distally on the colon and small bowel and therefore p.o. and rectal contrast would not allow [place] to have any better visualization.  Radiologist also said that the diffuse mesenteric edema seen on CT is most likely due to enteritis which go with the picture of nausea and vomiting.  I have done serial abdominal exams the patient remains with no tenderness.  She passed a p.o. trial with no further episodes of vomiting.  I discussed very strict return precautions with patient and her granddaughter and expect the patient will deteriorate if these are not a simple diverticulum.  Daughter will bring her back if she has any fever, any abdominal pain or starts vomiting again.  CT was also positive for an abnormality in the tail of the pancreas.  Labs are within normal limits.  Discussed these findings with patient and her daughter and recommended close follow-up with primary care doctor for further investigation of  possible malignancy.  Patient has no family history of pancreatic cancer.  At this time she stable for discharge home.      Alfred Levins, [place], MD 12/11/18 [phone]

## 2018-12-11 NOTE — ED Triage Notes (Signed)
Per EMS report, patient has history of dementia, was at church c/o "upset stomach." A church member called EMS concerned about breathing. Upon arrival, patient c/o feeling "cold", but denies pain or nausea. Daughter is at bedside.

## 2018-12-11 NOTE — ED Provider Notes (Signed)
[facility] Emergency Department Provider Note ____________________________________________   First MD Initiated Contact with Patient 12/11/18 1307     (approximate)  I have reviewed the triage vital signs and the nursing notes.   HISTORY  Chief Complaint Nausea  Level 5 caveat: History present illness limited due to dementia  HPI Joanna Aguilar is a 78 y.o. female with PMH as noted below who presents with nausea and shortness of breath while at church, now resolved.  Per the patient's family members (who were not with her when the symptoms started) bystanders reported that the patient had some nausea and then appeared as if she was having some trouble breathing.  This lasted for a few minutes and then appears to have resolved.  The patient denies any symptoms at this time although she does state that she was sick on her stomach and then short of breath for short time.  She has otherwise been at her baseline.  Past Medical History:  Diagnosis Date  . Dementia ([place])   . Hypertension   . Thyroid disease     Patient Active Problem List   Diagnosis Date Noted  . Acute upper respiratory infection 05/12/2018  . Cough 05/12/2018  . Thyroid disease 02/23/2018  . Essential hypertension 02/23/2018  . Dementia without behavioral disturbance ([place]) 02/23/2018  . B12 deficiency 12/06/2017    Past Surgical History:  Procedure Laterality Date  . ABDOMINAL HYSTERECTOMY      Prior to Admission medications   Medication Sig Start Date End Date Taking? Authorizing Provider  amLODipine (NORVASC) 5 MG tablet TAKE 1 TABLET BY MOUTH EVERY MORNING FOR HTN 06/30/18  Yes Boscia, Heather E, NP  donepezil (ARICEPT) 10 MG tablet TAKE ONE TABLET BY MOUTH ONCE DAILY IN THE MORNING FOR MEMORY 11/01/18  Yes Kendell Bane, NP  Levothyroxine Sodium (TIROSINT) 88 MCG CAPS Take by mouth daily before breakfast.   Yes [provider]  memantine (NAMENDA) 5 MG tablet TAKE 1  TABLET BY MOUTH EVERY DAY FOR MEMORY 04/21/18  Yes Boscia, Heather E, NP  ciprofloxacin (CIPRO) 500 MG tablet Take 1 tablet (500 mg total) by mouth 2 (two) times daily. 06/10/18   Lavera Guise, MD  olmesartan (BENICAR) 20 MG tablet Take 1 tablet (20 mg total) by mouth daily. 01/25/18   Ronnell Freshwater, NP  ondansetron (ZOFRAN) 4 MG tablet Take 1 tablet (4 mg total) by mouth daily as needed for nausea or vomiting. 06/16/15   Ahmed Prima, MD  triamcinolone cream (KENALOG) 0.1 % Apply 1 application topically 2 (two) times daily.    [provider]    Allergies Penicillins  No family history on file.  Social History Social History   Tobacco Use  . Smoking status: Never Smoker  . Smokeless tobacco: Never Used  Substance Use Topics  . Alcohol use: No  . Drug use: Not on file    Review of Systems Level 5 caveat: Review of systems limited due to dementia Constitutional: No fever. Cardiovascular: Denies chest pain. Respiratory: Positive for resolved shortness of breath. Gastrointestinal: Positive for resolved nausea.  No vomiting or diarrhea. Musculoskeletal: Negative for back pain. Skin: Negative for rash. Neurological: Negative for headache.   ____________________________________________   PHYSICAL EXAM:  VITAL SIGNS: ED Triage Vitals  Enc Vitals Group     BP 12/11/18 1258 (!) 146/90     Pulse Rate 12/11/18 1258 74     Resp 12/11/18 1258 18     Temp  12/11/18 1344 (!) 95.7 F (35.4 C)     Temp Source 12/11/18 1344 Rectal     SpO2 12/11/18 1258 100 %     Weight 12/11/18 1258 98 lb (44.5 kg)     Height 12/11/18 1258 5\' 2"  (1.575 m)     Head Circumference --      Peak Flow --      Pain Score 12/11/18 1258 0     Pain Loc --      Pain Edu? --      Excl. in [place]? --     Constitutional: Alert and oriented.  Relatively well appearing for age and in no acute distress. Eyes: Conjunctivae are normal.  EOMI.  No scleral icterus. Head: Atraumatic. Nose: No  congestion/rhinnorhea. Mouth/Throat: Mucous membranes are moist.   Neck: Normal range of motion.  Cardiovascular: Normal rate, regular rhythm. Grossly normal heart sounds.  Good peripheral circulation. Respiratory: Normal respiratory effort.  No retractions. Lungs CTAB. Gastrointestinal: Soft and nontender. No distention.  Genitourinary: No flank tenderness. Musculoskeletal: No lower extremity edema.  Extremities warm and well perfused.  Neurologic:  Normal speech and language. No gross focal neurologic deficits are appreciated.  Skin:  Skin is warm and dry. No rash noted. Psychiatric: Mood and affect are normal. Speech and behavior are normal.  ____________________________________________   LABS (all labs ordered are listed, but only abnormal results are displayed)  Labs Reviewed  BASIC METABOLIC PANEL - Abnormal; Notable for the following components:      Result Value   Glucose, Bld 145 (*)    All other components within normal limits  HEPATIC FUNCTION PANEL - Abnormal; Notable for the following components:   Total Protein 8.3 (*)    All other components within normal limits  CBC WITH DIFFERENTIAL/PLATELET - Abnormal; Notable for the following components:   MCH 25.7 (*)    Lymphs Abs 0.6 (*)    All other components within normal limits  URINALYSIS, COMPLETE (UACMP) WITH MICROSCOPIC - Abnormal; Notable for the following components:   Color, Urine STRAW (*)    APPearance CLEAR (*)    Ketones, ur 5 (*)    All other components within normal limits  LIPASE, BLOOD  TROPONIN I  LACTIC ACID, PLASMA  LACTIC ACID, PLASMA   ____________________________________________  EKG  ED ECG REPORT I, Arta Silence, the attending physician, personally viewed and interpreted this ECG.  Date: 12/11/2018 EKG Time: 1443 Rate: 69 Rhythm: normal sinus rhythm QRS Axis: normal Intervals: Prolonged QT ST/T Wave abnormalities: normal Narrative Interpretation: Nonspecific abnormalities  with poor quality baseline no evidence of acute ischemia; no significant change when compared to EKG of 06/17/2015  ____________________________________________  RADIOLOGY  CXR: No focal infiltrate  ____________________________________________   PROCEDURES  Procedure(s) performed: No  Procedures  Critical Care performed: No ____________________________________________   INITIAL IMPRESSION / ASSESSMENT AND PLAN / ED COURSE  Pertinent labs & imaging results that were available during my care of the patient were reviewed by me and considered in my medical decision making (see chart for details).  78 year old female with history of dementia and other PMH as noted above presents with a slightly unclear episode while at church today.  The family members present with her in the ED were not there when the symptoms happened and the patient's history is not totally reliable due to her dementia, however apparently she either had a sour taste or felt nauseous and then subsequently appeared to be somewhat short of breath.  The symptoms subsequently resolved.  The patient is at her baseline now, and denies any acute symptoms.  On exam the patient is very well-appearing.  Her vital signs are normal except for her temperature.  The RN had difficulty obtaining an oral or axillary temperature, and the rectal temperature came back at 95.  However the patient skin is warm to the touch, she is alert, comfortable appearing, and asymptomatic.  The family states the patient has a history of low temperature readings.  The remainder of the exam is unremarkable.  The abdomen is soft and nontender and the lungs are clear.  The EKG is of poor quality but shows no specific acute findings.  Overall I suspect GERD, gastritis, or other benign etiology with some nausea or pain precipitating shortness of breath.  Given the patient's well appearance and resolved symptoms I have low suspicion for ACS or other active  concerning etiology.  We will obtain basic and hepatobiliary labs as well as a chest x-ray.  If the work-up is unremarkable and the patient remains asymptomatic I anticipate discharge home.  ----------------------------------------- 3:13 PM on 12/11/2018 -----------------------------------------  The initial work-up is reassuring.  The labs and UA are unremarkable.  Chest x-ray shows no acute findings.  Troponin was not ordered initially, so I just added this on.  However, the patient started having vomiting and appeared near syncopal.  The family members now state that this is similar to what happened while she was at church.  She does not have any apparent shortness of breath but does appear lightheaded.  Given these persistent symptoms and the patient's age I think she will need some additional work-up.  I added on a lactate to help rule out sepsis given her hypothermia, I ordered fluids and antiemetic, and a CT of the abdomen.  If the patient's symptoms resolve, she returns to her baseline, and the work-up is negative she still might be able to go home.  I am signing the patient out to the oncoming physician Dr. Alfred Levins.  ____________________________________________   FINAL CLINICAL IMPRESSION(S) / ED DIAGNOSES  Final diagnoses:  Non-intractable vomiting with nausea, unspecified vomiting type      NEW MEDICATIONS STARTED DURING THIS VISIT:  New Prescriptions   No medications on file     Note:  This document was prepared using Dragon voice recognition software and may include unintentional dictation errors.    Arta Silence, MD 12/11/18 1515

## 2018-12-11 NOTE — ED Notes (Signed)
Patient vomited a large amount per family report. Dr. Cherylann Banas aware. Patient denies any discomfort at this time. Patient is calm, cooperative and pleasant.

## 2018-12-11 NOTE — ED Notes (Signed)
Patient amubulated to and from room bathroom with a steady gait. Patient remains calm and pleasant.

## 2018-12-11 NOTE — ED Notes (Signed)
Patient given ginger ale and saltines

## 2018-12-11 NOTE — ED Notes (Signed)
Patient transported to imaging.

## 2018-12-11 NOTE — ED Notes (Signed)
Patient tolerated saltines and ginger ale well.

## 2018-12-13 ENCOUNTER — Ambulatory Visit (INDEPENDENT_AMBULATORY_CARE_PROVIDER_SITE_OTHER): Payer: Medicare Other | Admitting: Adult Health

## 2018-12-13 ENCOUNTER — Encounter: Payer: Self-pay | Admitting: Adult Health

## 2018-12-13 VITALS — BP 110/70 | HR 79 | Temp 97.9°F | Resp 16 | Ht 64.0 in | Wt 92.2 lb

## 2018-12-13 DIAGNOSIS — F0390 Unspecified dementia without behavioral disturbance: Secondary | ICD-10-CM | POA: Diagnosis not present

## 2018-12-13 DIAGNOSIS — E538 Deficiency of other specified B group vitamins: Secondary | ICD-10-CM | POA: Diagnosis not present

## 2018-12-13 DIAGNOSIS — I10 Essential (primary) hypertension: Secondary | ICD-10-CM

## 2018-12-13 MED ORDER — CYANOCOBALAMIN 1000 MCG/ML IJ SOLN
1000.0000 ug | Freq: Once | INTRAMUSCULAR | Status: AC
  Administered 2018-12-13: 1000 ug via INTRAMUSCULAR

## 2018-12-13 NOTE — Patient Instructions (Signed)

## 2018-12-13 NOTE — Progress Notes (Signed)
[facility] [place], [place] [address]  Internal MEDICINE  Office Visit Note  Patient Name: Joanna Aguilar  [id number]  [id number]  Date of Service: 12/25/2018     Chief Complaint  Patient presents with  . Hospitalization Follow-up     HPI Pt is here for recent hospital follow up.  Patient is present in exam room with family members who report that the patient was at church when she began to feel sick on her stomach and as if she was having trouble breathing.  The symptoms lasted for a few minutes and the patient eventually returned to baseline.  Joanna Aguilar has a history of dementia therefore she is a poor historian for events.  She does report she remembers feeling nauseous and sick on her stomach.  She went to the emergency department where the emergency room physician suspects that she had a episode of GERD or gastritis or kind of benign event of nausea and pain that precipitated her shortness breath.  They did not admit her and allowed her to go home as her work-up was negative.  She presents today to the office stating that she feels great.  Her family members deny any ongoing issues since this event.  And she was told check in with her PCP.  Current Medication: Outpatient Encounter Medications as of 12/13/2018  Medication Sig  . amLODipine (NORVASC) 5 MG tablet TAKE 1 TABLET BY MOUTH EVERY MORNING FOR HTN  . ciprofloxacin (CIPRO) 500 MG tablet Take 1 tablet (500 mg total) by mouth 2 (two) times daily.  Marland Kitchen donepezil (ARICEPT) 10 MG tablet TAKE ONE TABLET BY MOUTH ONCE DAILY IN THE MORNING FOR MEMORY  . Levothyroxine Sodium (TIROSINT) 88 MCG CAPS Take by mouth daily before breakfast.  . memantine (NAMENDA) 5 MG tablet TAKE 1 TABLET BY MOUTH EVERY DAY FOR MEMORY  . olmesartan (BENICAR) 20 MG tablet Take 1 tablet (20 mg total) by mouth daily.  . ondansetron (ZOFRAN ODT) 4 MG disintegrating tablet Take 1 tablet (4 mg total) by mouth every 8 (eight) hours as  needed.  . triamcinolone cream (KENALOG) 0.1 % Apply 1 application topically 2 (two) times daily.  . [EXPIRED] cyanocobalamin ((VITAMIN B-12)) injection 1,000 mcg    No facility-administered encounter medications on file as of 12/13/2018.     Surgical History: Past Surgical History:  Procedure Laterality Date  . ABDOMINAL HYSTERECTOMY      Medical History: Past Medical History:  Diagnosis Date  . Dementia ([place])   . Hypertension   . Thyroid disease     Family History: History reviewed. No pertinent family history.  Social History   Socioeconomic History  . Marital status: Widowed    Spouse name: Not on file  . Number of children: Not on file  . Years of education: Not on file  . Highest education level: Not on file  Occupational History  . Not on file  Social Needs  . Financial resource strain: Not on file  . Food insecurity:    Worry: Not on file    Inability: Not on file  . Transportation needs:    Medical: Not on file    Non-medical: Not on file  Tobacco Use  . Smoking status: Never Smoker  . Smokeless tobacco: Never Used  Substance and Sexual Activity  . Alcohol use: No  . Drug use: Not on file  . Sexual activity: Not on file  Lifestyle  . Physical activity:    Days per  week: Not on file    Minutes per session: Not on file  . Stress: Not on file  Relationships  . Social connections:    Talks on phone: Not on file    Gets together: Not on file    Attends religious service: Not on file    Active member of club or organization: Not on file    Attends meetings of clubs or organizations: Not on file    Relationship status: Not on file  . Intimate partner violence:    Fear of current or ex partner: Not on file    Emotionally abused: Not on file    Physically abused: Not on file    Forced sexual activity: Not on file  Other Topics Concern  . Not on file  Social History Narrative  . Not on file      Review of Systems  Constitutional: Negative for  chills, fatigue and unexpected weight change.  HENT: Negative for congestion, rhinorrhea, sneezing and sore throat.   Eyes: Negative for photophobia, pain and redness.  Respiratory: Negative for cough, chest tightness and shortness of breath.   Cardiovascular: Negative for chest pain and palpitations.  Gastrointestinal: Negative for abdominal pain, constipation, diarrhea, nausea and vomiting.  Endocrine: Negative.   Genitourinary: Negative for dysuria and frequency.  Musculoskeletal: Negative for arthralgias, back pain, joint swelling and neck pain.  Skin: Negative for rash.  Allergic/Immunologic: Negative.   Neurological: Negative for tremors and numbness.  Hematological: Negative for adenopathy. Does not bruise/bleed easily.  Psychiatric/Behavioral: Negative for behavioral problems and sleep disturbance. The patient is not nervous/anxious.     Vital Signs: BP 110/70   Pulse 79   Temp 97.9 F (36.6 C)   Resp 16   Ht 5\' 4"  (1.626 m)   Wt 92 lb 3.2 oz (41.8 kg)   SpO2 98%   BMI 15.83 kg/m    Physical Exam Vitals signs and nursing note reviewed.  Constitutional:      General: She is not in acute distress.    Appearance: She is well-developed. She is not diaphoretic.  HENT:     Head: Normocephalic and atraumatic.     Mouth/Throat:     Pharynx: No oropharyngeal exudate.  Eyes:     Pupils: Pupils are equal, round, and reactive to light.  Neck:     Musculoskeletal: Normal range of motion and neck supple.     Thyroid: No thyromegaly.     Vascular: No JVD.     Trachea: No tracheal deviation.  Cardiovascular:     Rate and Rhythm: Normal rate and regular rhythm.     Heart sounds: Normal heart sounds. No murmur. No friction rub. No gallop.   Pulmonary:     Effort: Pulmonary effort is normal. No respiratory distress.     Breath sounds: Normal breath sounds. No wheezing or rales.  Chest:     Chest wall: No tenderness.  Abdominal:     Palpations: Abdomen is soft.      Tenderness: There is no abdominal tenderness. There is no guarding.  Musculoskeletal: Normal range of motion.  Lymphadenopathy:     Cervical: No cervical adenopathy.  Skin:    General: Skin is warm and dry.  Neurological:     Mental Status: She is alert and oriented to person, place, and time.     Cranial Nerves: No cranial nerve deficit.  Psychiatric:        Behavior: Behavior normal.        Thought  Content: Thought content normal.        Judgment: Judgment normal.    Assessment/Plan: 1. Essential hypertension Stable, continue current medication regimen.  2. B12 deficiency Patient was given B12 injection today in office. - cyanocobalamin ((VITAMIN B-12)) injection 1,000 mcg  3. Dementia without behavioral disturbance, unspecified dementia type ([place]) Stable at this time, patient's family are involved and assist her in any way they can.  General Counseling: jaeliana lococo understanding of the findings of todays visit and agrees with plan of treatment. I have discussed any further diagnostic evaluation that may be needed or ordered today. We also reviewed her medications today. she has been encouraged to call the office with any questions or concerns that should arise related to todays visit.    No orders of the defined types were placed in this encounter.     I have reviewed all medical records from hospital follow up including radiology reports and consults from other physicians. Appropriate follow up diagnostics will be scheduled as needed. Patient/ Family understands the plan of treatment.   Time spent 30 minutes.   Orson Gear AGNP-C Internal Medicine

## 2018-12-23 ENCOUNTER — Ambulatory Visit
Admission: RE | Admit: 2018-12-23 | Discharge: 2018-12-23 | Disposition: A | Discharge status: Home / Self Care | Payer: Medicare Other | Source: Ambulatory Visit | Attending: Internal Medicine | Admitting: Internal Medicine

## 2018-12-23 DIAGNOSIS — Z1231 Encounter for screening mammogram for malignant neoplasm of breast: Secondary | ICD-10-CM | POA: Insufficient documentation

## 2018-12-26 ENCOUNTER — Other Ambulatory Visit: Payer: Self-pay

## 2018-12-26 MED ORDER — MEMANTINE HCL 5 MG PO TABS: ORAL_TABLET | ORAL | 1 refills | Status: DC

## 2019-01-24 DIAGNOSIS — R600 Localized edema: Secondary | ICD-10-CM | POA: Diagnosis not present

## 2019-01-24 DIAGNOSIS — I10 Essential (primary) hypertension: Secondary | ICD-10-CM | POA: Diagnosis not present

## 2019-01-24 DIAGNOSIS — E890 Postprocedural hypothyroidism: Secondary | ICD-10-CM | POA: Diagnosis not present

## 2019-01-24 DIAGNOSIS — M818 Other osteoporosis without current pathological fracture: Secondary | ICD-10-CM | POA: Diagnosis not present

## 2019-01-24 DIAGNOSIS — E0500 Thyrotoxicosis with diffuse goiter without thyrotoxic crisis or storm: Secondary | ICD-10-CM | POA: Diagnosis not present

## 2019-01-31 DIAGNOSIS — E042 Nontoxic multinodular goiter: Secondary | ICD-10-CM | POA: Diagnosis not present

## 2019-01-31 DIAGNOSIS — M818 Other osteoporosis without current pathological fracture: Secondary | ICD-10-CM | POA: Diagnosis not present

## 2019-01-31 DIAGNOSIS — I10 Essential (primary) hypertension: Secondary | ICD-10-CM | POA: Diagnosis not present

## 2019-01-31 DIAGNOSIS — R600 Localized edema: Secondary | ICD-10-CM | POA: Diagnosis not present

## 2019-01-31 DIAGNOSIS — E0500 Thyrotoxicosis with diffuse goiter without thyrotoxic crisis or storm: Secondary | ICD-10-CM | POA: Diagnosis not present

## 2019-02-28 ENCOUNTER — Encounter: Payer: Self-pay | Admitting: Nurse Practitioner

## 2019-02-28 ENCOUNTER — Other Ambulatory Visit: Payer: Self-pay

## 2019-02-28 ENCOUNTER — Ambulatory Visit (INDEPENDENT_AMBULATORY_CARE_PROVIDER_SITE_OTHER): Payer: Medicare Other | Admitting: Nurse Practitioner

## 2019-02-28 VITALS — Ht 64.0 in | Wt 92.0 lb

## 2019-02-28 DIAGNOSIS — I10 Essential (primary) hypertension: Secondary | ICD-10-CM

## 2019-02-28 DIAGNOSIS — E538 Deficiency of other specified B group vitamins: Secondary | ICD-10-CM

## 2019-02-28 DIAGNOSIS — E890 Postprocedural hypothyroidism: Secondary | ICD-10-CM | POA: Diagnosis not present

## 2019-02-28 NOTE — Progress Notes (Signed)
[facility] [place], [place] [address]  Internal MEDICINE  Telephone Visit  Patient Name: Joanna Aguilar  [id number]  [id number]  Date of Service: 03/15/2019  I connected with the patient at 11:17am by telephone and verified the patients identity using two identifiers.   I discussed the limitations, risks, security and privacy concerns of performing an evaluation and management service by telephone and the availability of in person appointments. I also discussed with the patient that there may be a patient responsible charge related to the service.  The patient expressed understanding and agrees to proceed.    Chief Complaint  Patient presents with  . Medical Management of Chronic Issues    7 month follow up  . Hypertension  . Injections    pt was getting B12 injections and wants to know if there is a rx that we can send in for her so that she wont have to come out  . Dementia  . Telephone Screen  . Telephone Assessment    The patient has been contacted via telephone for follow up visit due to concerns for spread of novel coronavirus. She states she has no concerns or complaints today. She states that hot flashes and night sweats have improved since her last visit. States she is sleeping much better.       Current Medication: Outpatient Encounter Medications as of 02/28/2019  Medication Sig  . amLODipine (NORVASC) 5 MG tablet TAKE 1 TABLET BY MOUTH EVERY MORNING FOR HTN  . donepezil (ARICEPT) 10 MG tablet TAKE ONE TABLET BY MOUTH ONCE DAILY IN THE MORNING FOR MEMORY  . Levothyroxine Sodium (TIROSINT) 88 MCG CAPS Take by mouth daily before breakfast.  . memantine (NAMENDA) 5 MG tablet TAKE 1 TABLET BY MOUTH EVERY DAY FOR MEMORY  . olmesartan (BENICAR) 20 MG tablet Take 1 tablet (20 mg total) by mouth daily.  . ondansetron (ZOFRAN ODT) 4 MG disintegrating tablet Take 1 tablet (4 mg total) by mouth every 8 (eight) hours as needed.  Marland Kitchen SYNTHROID 88 MCG  tablet Take 88 mcg by mouth daily.  Marland Kitchen triamcinolone cream (KENALOG) 0.1 % Apply 1 application topically 2 (two) times daily.  . [DISCONTINUED] ciprofloxacin (CIPRO) 500 MG tablet Take 1 tablet (500 mg total) by mouth 2 (two) times daily. (Patient not taking: Reported on 02/28/2019)   No facility-administered encounter medications on file as of 02/28/2019.     Surgical History: Past Surgical History:  Procedure Laterality Date  . ABDOMINAL HYSTERECTOMY      Medical History: Past Medical History:  Diagnosis Date  . Dementia ([place])   . Hypertension   . Thyroid disease     Family History: Family History  Problem Relation Age of Onset  . Diabetes Neg Hx   . Arthritis Neg Hx     Social History   Socioeconomic History  . Marital status: Widowed    Spouse name: Not on file  . Number of children: Not on file  . Years of education: Not on file  . Highest education level: Not on file  Occupational History  . Not on file  Social Needs  . Financial resource strain: Not on file  . Food insecurity:    Worry: Not on file    Inability: Not on file  . Transportation needs:    Medical: Not on file    Non-medical: Not on file  Tobacco Use  . Smoking status: Never Smoker  . Smokeless tobacco: Never Used  Substance and  Sexual Activity  . Alcohol use: No  . Drug use: Not on file  . Sexual activity: Not on file  Lifestyle  . Physical activity:    Days per week: Not on file    Minutes per session: Not on file  . Stress: Not on file  Relationships  . Social connections:    Talks on phone: Not on file    Gets together: Not on file    Attends religious service: Not on file    Active member of club or organization: Not on file    Attends meetings of clubs or organizations: Not on file    Relationship status: Not on file  . Intimate partner violence:    Fear of current or ex partner: Not on file    Emotionally abused: Not on file    Physically abused: Not on file    Forced sexual  activity: Not on file  Other Topics Concern  . Not on file  Social History Narrative  . Not on file      Review of Systems  Constitutional: Negative for chills, fatigue and unexpected weight change.  HENT: Negative for congestion, rhinorrhea, sneezing and sore throat.   Respiratory: Negative for cough, chest tightness and shortness of breath.   Cardiovascular: Negative for chest pain and palpitations.  Gastrointestinal: Negative for abdominal pain, constipation, diarrhea, nausea and vomiting.  Endocrine: Negative for cold intolerance, heat intolerance, polydipsia and polyuria.  Musculoskeletal: Negative for arthralgias, back pain, joint swelling and neck pain.  Skin: Negative for rash.  Allergic/Immunologic: Negative.   Neurological: Negative for dizziness, tremors, numbness and headaches.  Hematological: Negative for adenopathy. Does not bruise/bleed easily.  Psychiatric/Behavioral: Negative for behavioral problems and sleep disturbance. The patient is not nervous/anxious.     Today's Vitals   02/28/19 0934  Weight: 92 lb (41.7 kg)  Height: 5\' 4"  (1.626 m)   Body mass index is 15.79 kg/m.  Observation/Objective:   the patient is alert and oriented. She is pleasant and answers all questions appropriately. She is in no acute distress at this time.    Assessment/Plan: 1. Essential hypertension Blood pressure has been stable. Continue bp medication as prescribed   2. Postablative hypothyroidism Thyroid dong well. Continue thyroid meds as prescribed   3. B12 deficiency Recommend oral B12 supplement daily until able to get back to the office for monthly b12 injections .  General Counseling: Joanna Aguilar understanding of the findings of today's phone visit and agrees with plan of treatment. I have discussed any further diagnostic evaluation that may be needed or ordered today. We also reviewed her medications today. she has been encouraged to call the office with any  questions or concerns that should arise related to todays visit.  Hypertension Counseling:   The following hypertensive lifestyle modification were recommended and discussed:  1. Limiting alcohol intake to less than 1 oz/day of ethanol:(24 oz of beer or 8 oz of wine or 2 oz of 100-proof whiskey). 2. Take baby ASA 81 mg daily. 3. Importance of regular aerobic exercise and losing weight. 4. Reduce dietary saturated fat and cholesterol intake for overall cardiovascular health. 5. Maintaining adequate dietary potassium, calcium, and magnesium intake. 6. Regular monitoring of the blood pressure. 7. Reduce sodium intake to less than 100 mmol/day (less than 2.3 gm of sodium or less than 6 gm of sodium choride)   This patient was seen by [place] with Dr Lavera Guise as a part of collaborative care agreement  Time spent: [facility]

## 2019-03-15 DIAGNOSIS — E890 Postprocedural hypothyroidism: Secondary | ICD-10-CM | POA: Insufficient documentation

## 2019-04-04 ENCOUNTER — Other Ambulatory Visit: Payer: Self-pay

## 2019-04-04 MED ORDER — AMLODIPINE BESYLATE 5 MG PO TABS: ORAL_TABLET | ORAL | 2 refills | Status: DC

## 2019-05-24 ENCOUNTER — Other Ambulatory Visit: Payer: Self-pay | Admitting: Adult Health

## 2019-06-13 ENCOUNTER — Ambulatory Visit: Payer: Medicare Other | Admitting: Adult Health

## 2019-08-08 DIAGNOSIS — E042 Nontoxic multinodular goiter: Secondary | ICD-10-CM | POA: Diagnosis not present

## 2019-08-08 DIAGNOSIS — E0500 Thyrotoxicosis with diffuse goiter without thyrotoxic crisis or storm: Secondary | ICD-10-CM | POA: Diagnosis not present

## 2019-08-08 DIAGNOSIS — M818 Other osteoporosis without current pathological fracture: Secondary | ICD-10-CM | POA: Diagnosis not present

## 2019-08-08 DIAGNOSIS — I10 Essential (primary) hypertension: Secondary | ICD-10-CM | POA: Diagnosis not present

## 2019-08-08 DIAGNOSIS — E890 Postprocedural hypothyroidism: Secondary | ICD-10-CM | POA: Diagnosis not present

## 2019-08-22 DIAGNOSIS — R600 Localized edema: Secondary | ICD-10-CM | POA: Diagnosis not present

## 2019-08-22 DIAGNOSIS — I10 Essential (primary) hypertension: Secondary | ICD-10-CM | POA: Diagnosis not present

## 2019-08-22 DIAGNOSIS — E042 Nontoxic multinodular goiter: Secondary | ICD-10-CM | POA: Diagnosis not present

## 2019-08-22 DIAGNOSIS — E0500 Thyrotoxicosis with diffuse goiter without thyrotoxic crisis or storm: Secondary | ICD-10-CM | POA: Diagnosis not present

## 2019-08-25 DIAGNOSIS — E0500 Thyrotoxicosis with diffuse goiter without thyrotoxic crisis or storm: Secondary | ICD-10-CM | POA: Diagnosis not present

## 2019-08-25 DIAGNOSIS — E042 Nontoxic multinodular goiter: Secondary | ICD-10-CM | POA: Diagnosis not present

## 2019-08-25 DIAGNOSIS — E890 Postprocedural hypothyroidism: Secondary | ICD-10-CM | POA: Diagnosis not present

## 2019-08-29 DIAGNOSIS — E042 Nontoxic multinodular goiter: Secondary | ICD-10-CM | POA: Diagnosis not present

## 2019-08-29 DIAGNOSIS — E890 Postprocedural hypothyroidism: Secondary | ICD-10-CM | POA: Diagnosis not present

## 2019-08-29 DIAGNOSIS — E0500 Thyrotoxicosis with diffuse goiter without thyrotoxic crisis or storm: Secondary | ICD-10-CM | POA: Diagnosis not present

## 2019-08-29 DIAGNOSIS — M818 Other osteoporosis without current pathological fracture: Secondary | ICD-10-CM | POA: Diagnosis not present

## 2019-08-29 DIAGNOSIS — R600 Localized edema: Secondary | ICD-10-CM | POA: Diagnosis not present

## 2019-09-18 DIAGNOSIS — E0500 Thyrotoxicosis with diffuse goiter without thyrotoxic crisis or storm: Secondary | ICD-10-CM | POA: Diagnosis not present

## 2019-09-18 DIAGNOSIS — E042 Nontoxic multinodular goiter: Secondary | ICD-10-CM | POA: Diagnosis not present

## 2019-09-18 DIAGNOSIS — E890 Postprocedural hypothyroidism: Secondary | ICD-10-CM | POA: Diagnosis not present

## 2019-09-26 DIAGNOSIS — E0500 Thyrotoxicosis with diffuse goiter without thyrotoxic crisis or storm: Secondary | ICD-10-CM | POA: Diagnosis not present

## 2019-09-26 DIAGNOSIS — R600 Localized edema: Secondary | ICD-10-CM | POA: Diagnosis not present

## 2019-09-26 DIAGNOSIS — M818 Other osteoporosis without current pathological fracture: Secondary | ICD-10-CM | POA: Diagnosis not present

## 2019-09-26 DIAGNOSIS — I10 Essential (primary) hypertension: Secondary | ICD-10-CM | POA: Diagnosis not present

## 2019-09-26 DIAGNOSIS — E890 Postprocedural hypothyroidism: Secondary | ICD-10-CM | POA: Diagnosis not present

## 2019-09-26 DIAGNOSIS — E042 Nontoxic multinodular goiter: Secondary | ICD-10-CM | POA: Diagnosis not present

## 2019-09-28 IMAGING — MG DIGITAL SCREENING BILATERAL MAMMOGRAM WITH TOMO AND CAD
8 series · 9 of 24 positions shown · non-contrast
Comparison: Previous exam(s).

CLINICAL DATA: Screening.

EXAM:
DIGITAL SCREENING BILATERAL MAMMOGRAM WITH TOMO AND CAD

[L MLO synth-2D]
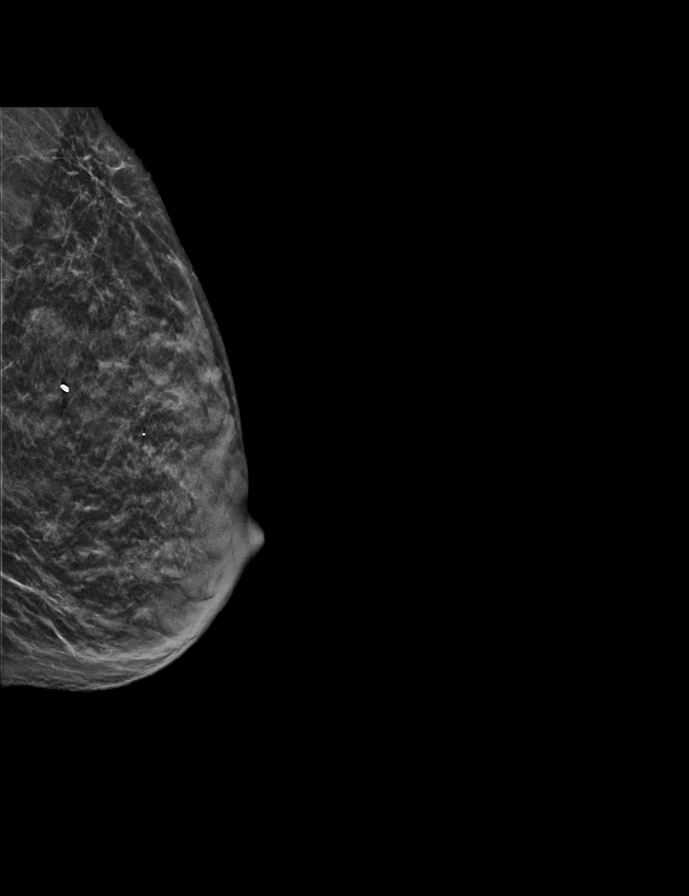

[L CC synth-2D]
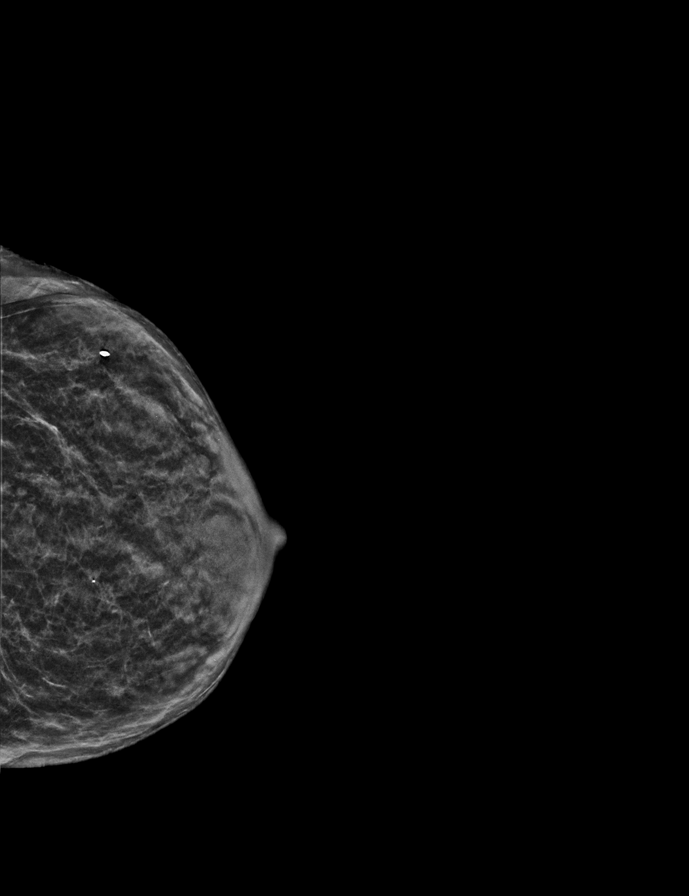

[R MLO synth-2D]
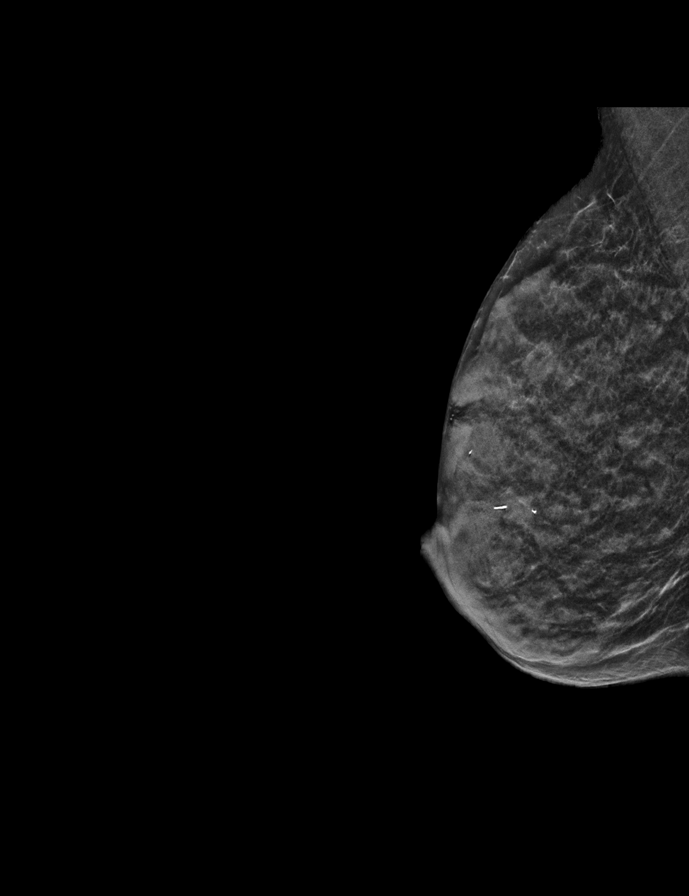

[R CC synth-2D]
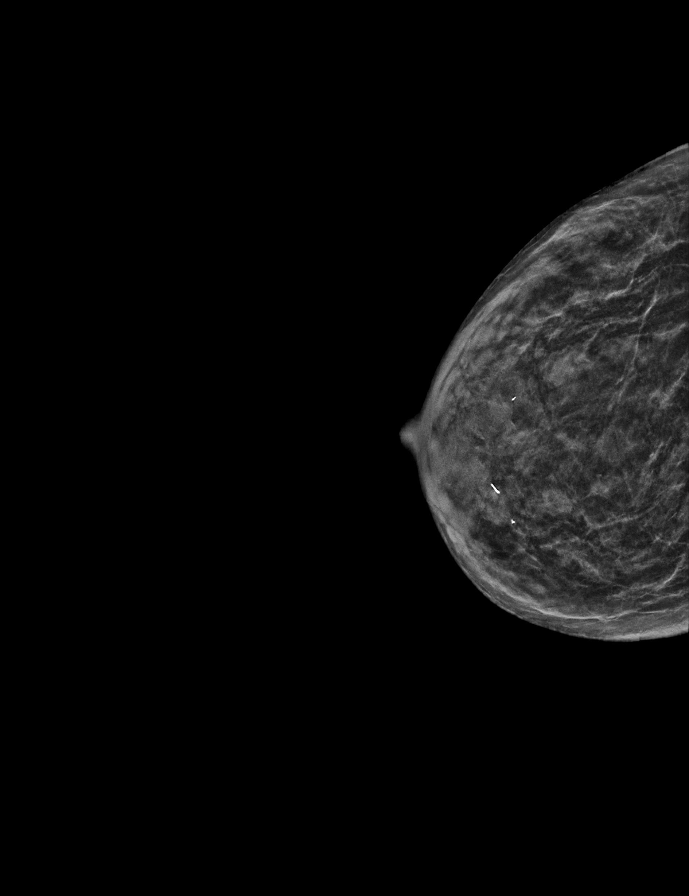

[R CC tomo · 2 of 39 frames shown]
[frame 13/39]
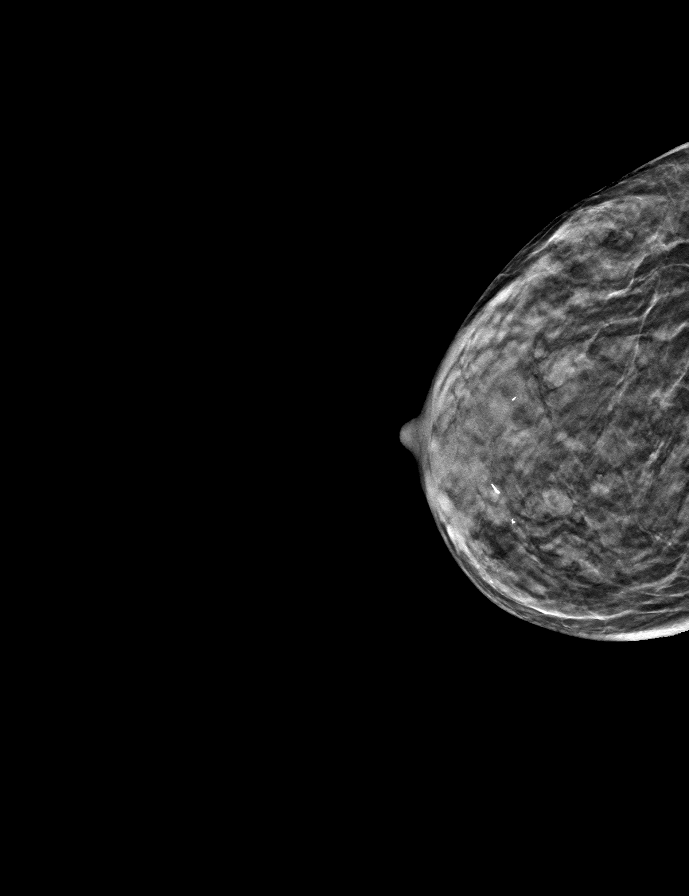
[frame 20/39]
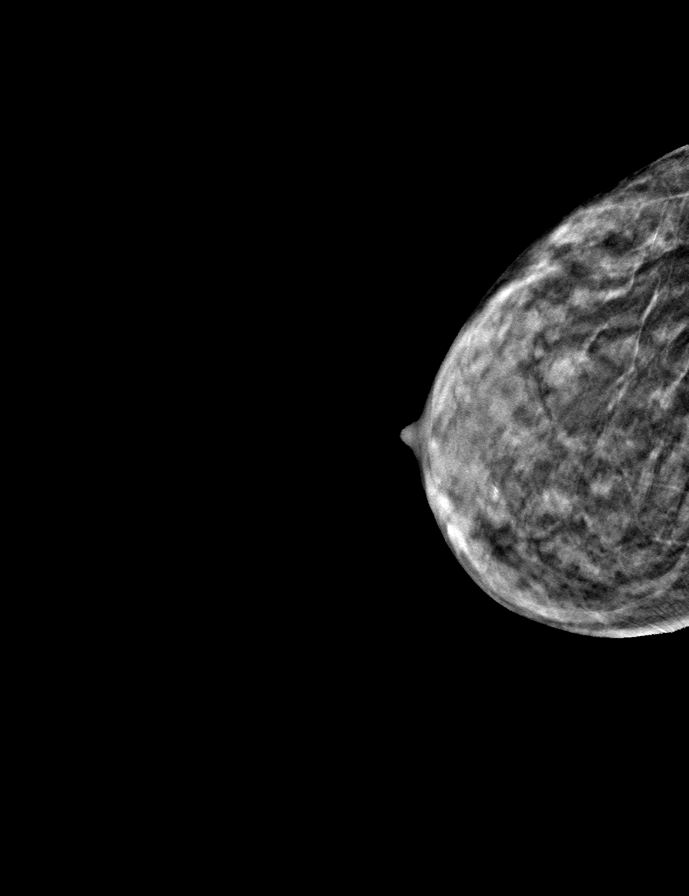

[R MLO tomo · tomo slice 19/37.0]
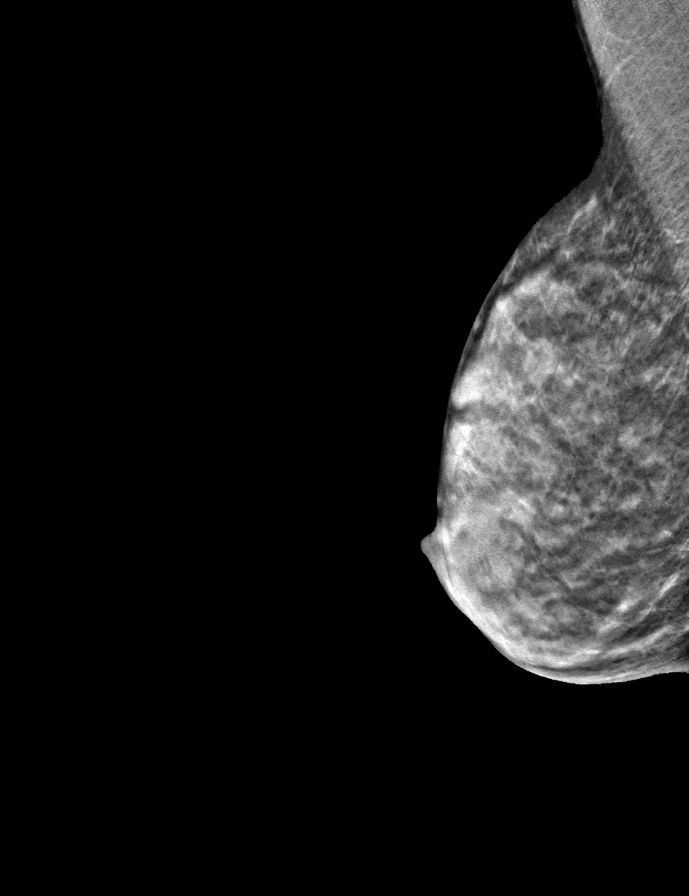

[L MLO tomo · tomo slice 19/38.0]
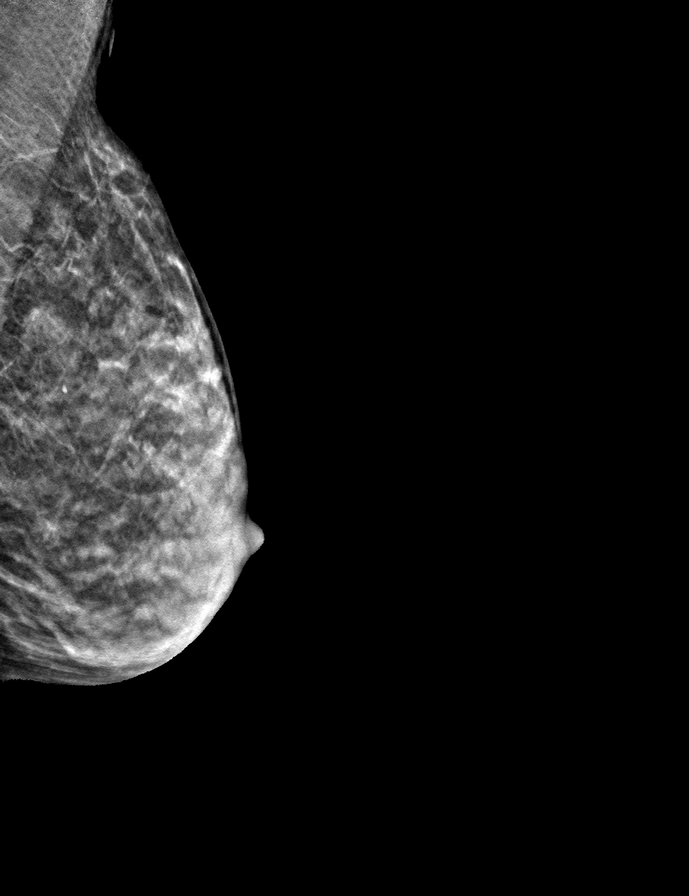

[L CC tomo · tomo slice 19/38.0]
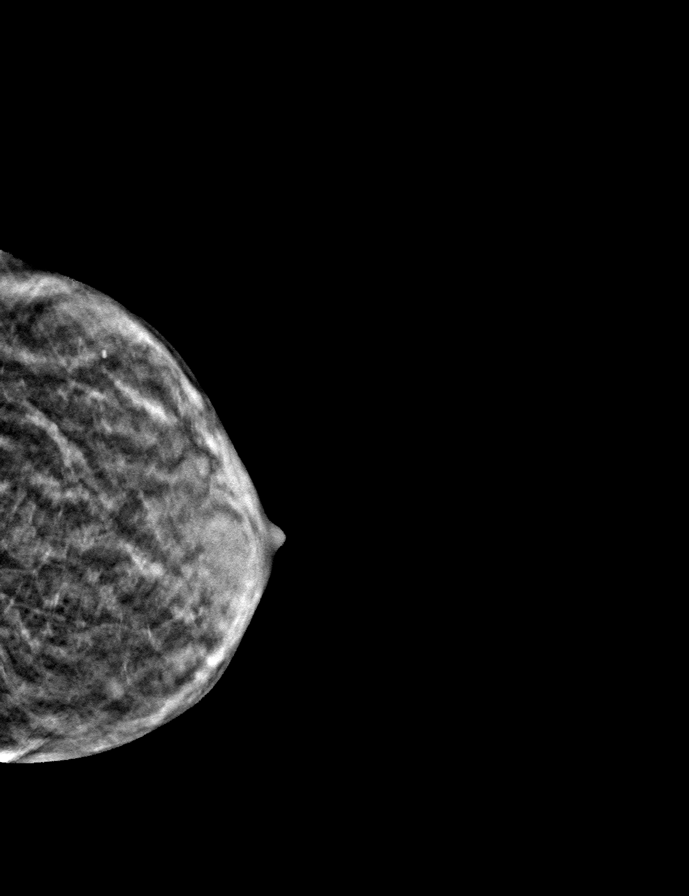

[9 of 24 positions shown; findings below may reference images not displayed]

ACR Breast Density Category d: The breast tissue is extremely dense,
which lowers the sensitivity of mammography
FINDINGS: There are no findings suspicious for malignancy. Images were
processed with CAD.
IMPRESSION: No mammographic evidence of malignancy. A result letter of this
screening mammogram will be mailed directly to the patient.

RECOMMENDATION:
Screening mammogram in one year. (Code:WO-0-ZI0)

BI-RADS CATEGORY  1: Negative.

## 2019-12-29 ENCOUNTER — Other Ambulatory Visit: Payer: Self-pay

## 2019-12-29 ENCOUNTER — Ambulatory Visit: Payer: Medicare Other | Attending: Internal Medicine

## 2019-12-29 DIAGNOSIS — Z23 Encounter for immunization: Secondary | ICD-10-CM | POA: Insufficient documentation

## 2019-12-29 NOTE — Progress Notes (Signed)
   Covid-19 Vaccination Clinic  Name:  Joanna Aguilar    MRN: [id number] DOB: 1941-07-20  12/29/2019  Joanna Aguilar was observed post Covid-19 immunization for 15 minutes without incidence. She was provided with Vaccine Information Sheet and instruction to access the V-Safe system.   Joanna Aguilar was instructed to call 911 with any severe reactions post vaccine: Marland Kitchen Difficulty breathing  . Swelling of your face and throat  . A fast heartbeat  . A bad rash all over your body  . Dizziness and weakness    Immunizations Administered    Name Date Dose VIS Date Route   Moderna COVID-19 Vaccine 12/29/2019 12:05 PM 0.5 mL 10/17/2019 Intramuscular   Manufacturer: Moderna   Lot: [id number]   [id number]

## 2020-01-29 ENCOUNTER — Ambulatory Visit: Payer: Medicare Other | Attending: Internal Medicine

## 2020-01-29 DIAGNOSIS — Z23 Encounter for immunization: Secondary | ICD-10-CM

## 2020-01-29 NOTE — Progress Notes (Signed)
   Covid-19 Vaccination Clinic  Name:  Joanna Aguilar    MRN: [id number] DOB: June 28, 1941  01/29/2020  [name] was observed post Covid-19 immunization for 15 minutes without incident. She was provided with Vaccine Information Sheet and instruction to access the V-Safe system.   [name] was instructed to call 911 with any severe reactions post vaccine: Marland Kitchen Difficulty breathing  . Swelling of face and throat  . A fast heartbeat  . A bad rash all over body  . Dizziness and weakness   Immunizations Administered    Name Date Dose VIS Date Route   Moderna COVID-19 Vaccine 01/29/2020 12:05 PM 0.5 mL 10/17/2019 Intramuscular   Manufacturer: Moderna   Lot: [id number]   [place][id number]

## 2020-01-30 ENCOUNTER — Telehealth: Payer: Self-pay

## 2020-01-30 NOTE — Telephone Encounter (Signed)
VM FULL UNABLE TO LMOM TO CONFIRM 02-01-20 OV.

## 2020-02-01 ENCOUNTER — Encounter: Payer: Self-pay | Admitting: Nurse Practitioner

## 2020-02-01 ENCOUNTER — Other Ambulatory Visit: Payer: Self-pay

## 2020-02-01 ENCOUNTER — Ambulatory Visit (INDEPENDENT_AMBULATORY_CARE_PROVIDER_SITE_OTHER): Payer: Medicare Other | Admitting: Nurse Practitioner

## 2020-02-01 ENCOUNTER — Other Ambulatory Visit: Payer: Self-pay | Admitting: Nurse Practitioner

## 2020-02-01 VITALS — BP 142/95 | HR 58 | Temp 95.7°F | Resp 16 | Ht 64.0 in | Wt 95.2 lb

## 2020-02-01 DIAGNOSIS — K862 Cyst of pancreas: Secondary | ICD-10-CM | POA: Diagnosis not present

## 2020-02-01 DIAGNOSIS — D378 Neoplasm of uncertain behavior of other specified digestive organs: Secondary | ICD-10-CM

## 2020-02-01 DIAGNOSIS — E559 Vitamin D deficiency, unspecified: Secondary | ICD-10-CM | POA: Diagnosis not present

## 2020-02-01 DIAGNOSIS — R1084 Generalized abdominal pain: Secondary | ICD-10-CM

## 2020-02-01 DIAGNOSIS — I10 Essential (primary) hypertension: Secondary | ICD-10-CM | POA: Diagnosis not present

## 2020-02-01 DIAGNOSIS — D519 Vitamin B12 deficiency anemia, unspecified: Secondary | ICD-10-CM | POA: Diagnosis not present

## 2020-02-01 DIAGNOSIS — Z0001 Encounter for general adult medical examination with abnormal findings: Secondary | ICD-10-CM | POA: Diagnosis not present

## 2020-02-01 NOTE — Progress Notes (Signed)
[facility] [place], [place] [address]  Internal MEDICINE  Office Visit Note  Patient Name: Joanna Aguilar  [id number]  [id number]  Date of Service: 02/10/2020  Chief Complaint  Patient presents with  . Follow-up    requestin mri of brain, to see how her dementia progressed   . Hypertension  . Pancreatitis    went to er last year and saw a sport on pancreas, stomach hurts at times and gets nausea and vomiting , nausea medication helps     The patient is here for follow up visit. She has not been seen in the office for close to one year. She is here with her daughter. States that patient has intermittent, severe abdominal pain. Pain causes nausea and vomiting. She has had this in the past. Prior CT scan, done in ER 2020 showed pancreatic mass on tail of pancreas as well as significant diverticulosis. The patient denies diarrhea or constipation.       Current Medication: Outpatient Encounter Medications as of 02/01/2020  Medication Sig  . amLODipine (NORVASC) 5 MG tablet TAKE 1 TABLET BY MOUTH EVERY MORNING FOR HTN  . donepezil (ARICEPT) 10 MG tablet TAKE ONE TABLET BY MOUTH ONCE DAILY IN THE MORNING FOR MEMORY  . memantine (NAMENDA) 5 MG tablet TAKE 1 TABLET BY MOUTH EVERY DAY FOR MEMORY  . olmesartan (BENICAR) 20 MG tablet Take 1 tablet (20 mg total) by mouth daily.  . ondansetron (ZOFRAN ODT) 4 MG disintegrating tablet Take 1 tablet (4 mg total) by mouth every 8 (eight) hours as needed.  Marland Kitchen SYNTHROID 88 MCG tablet Take 88 mcg by mouth daily.  Marland Kitchen triamcinolone cream (KENALOG) 0.1 % Apply 1 application topically 2 (two) times daily.  . [DISCONTINUED] Levothyroxine Sodium (TIROSINT) 88 MCG CAPS Take by mouth daily before breakfast.   No facility-administered encounter medications on file as of 02/01/2020.    Surgical History: Past Surgical History:  Procedure Laterality Date  . ABDOMINAL HYSTERECTOMY      Medical History: Past Medical History:   Diagnosis Date  . Dementia ([place])   . Hypertension   . Thyroid disease     Family History: Family History  Problem Relation Age of Onset  . Diabetes Neg Hx   . Arthritis Neg Hx     Social History   Socioeconomic History  . Marital status: Widowed    Spouse name: Not on file  . Number of children: Not on file  . Years of education: Not on file  . Highest education level: Not on file  Occupational History  . Not on file  Tobacco Use  . Smoking status: Never Smoker  . Smokeless tobacco: Never Used  Substance and Sexual Activity  . Alcohol use: No  . Drug use: Never  . Sexual activity: Not on file  Other Topics Concern  . Not on file  Social History Narrative  . Not on file   Social Determinants of Health   Financial Resource Strain:   . Difficulty of Paying Living Expenses:   Food Insecurity:   . Worried About Charity fundraiser in the Last Year:   . Arboriculturist in the Last Year:   Transportation Needs:   . Film/video editor (Medical):   Marland Kitchen Lack of Transportation (Non-Medical):   Physical Activity:   . Days of Exercise per Week:   . Minutes of Exercise per Session:   Stress:   . Feeling of Stress :  Social Connections:   . Frequency of Communication with Friends and Family:   . Frequency of Social Gatherings with Friends and Family:   . Attends Religious Services:   . Active Member of Clubs or Organizations:   . Attends Archivist Meetings:   Marland Kitchen Marital Status:   Intimate Partner Violence:   . Fear of Current or Ex-Partner:   . Emotionally Abused:   Marland Kitchen Physically Abused:   . Sexually Abused:       Review of Systems  Constitutional: Negative for activity change, chills, fatigue and unexpected weight change.  HENT: Negative for congestion, rhinorrhea, sneezing and sore throat.   Respiratory: Negative for cough, chest tightness and shortness of breath.   Cardiovascular: Negative for chest pain and palpitations.  Gastrointestinal:  Positive for abdominal pain. Negative for constipation, diarrhea, nausea and vomiting.  Endocrine: Negative for cold intolerance, heat intolerance, polydipsia and polyuria.  Musculoskeletal: Negative for arthralgias, back pain, joint swelling and neck pain.  Skin: Negative for rash.  Allergic/Immunologic: Negative for environmental allergies.  Neurological: Negative for dizziness, tremors, numbness and headaches.  Hematological: Negative for adenopathy. Does not bruise/bleed easily.  Psychiatric/Behavioral: Negative for behavioral problems and sleep disturbance. The patient is not nervous/anxious.     Today's Vitals   02/01/20 1031  BP: (!) 142/95  Pulse: (!) 58  Resp: 16  Temp: (!) 95.7 F (35.4 C)  SpO2: 98%  Weight: 95 lb 3.2 oz (43.2 kg)  Height: 5\' 4"  (1.626 m)   Body mass index is 16.34 kg/m.  Physical Exam Vitals and nursing note reviewed.  Constitutional:      General: She is not in acute distress.    Appearance: Normal appearance. She is well-developed. She is not diaphoretic.  HENT:     Head: Normocephalic and atraumatic.     Nose: Nose normal.     Mouth/Throat:     Pharynx: No oropharyngeal exudate.  Eyes:     Pupils: Pupils are equal, round, and reactive to light.  Neck:     Thyroid: No thyromegaly.     Vascular: No carotid bruit or JVD.     Trachea: No tracheal deviation.  Cardiovascular:     Rate and Rhythm: Normal rate and regular rhythm.     Heart sounds: Normal heart sounds. No murmur. No friction rub. No gallop.   Pulmonary:     Effort: Pulmonary effort is normal. No respiratory distress.     Breath sounds: Normal breath sounds. No wheezing or rales.  Chest:     Chest wall: No tenderness.  Abdominal:     General: Bowel sounds are normal.     Palpations: Abdomen is soft.     Tenderness: There is no abdominal tenderness.  Musculoskeletal:        General: Normal range of motion.     Cervical back: Normal range of motion and neck supple.   Lymphadenopathy:     Cervical: No cervical adenopathy.  Skin:    General: Skin is warm and dry.  Neurological:     Mental Status: She is alert and oriented to person, place, and time. Mental status is at baseline.     Cranial Nerves: No cranial nerve deficit.  Psychiatric:        Behavior: Behavior normal.        Thought Content: Thought content normal.        Judgment: Judgment normal.   Assessment/Plan: 1. Generalized abdominal pain Patient having intermittent, severe abdominal pain. Reviewed results of  abdominal CT done in ER 12/11/2018, showing 0.8 by 0.6 cm hypodense lesion in the tail the pancreas, with mild prominence of the dorsal pancreatic duct. The appearance is not entirely specific given the very small size of the lesion, although possibilities include a postinflammatory cyst or small intraductal papillary mucinous neoplasm. Will get CT of abdomen and pelvis for further evaluation and refer as indicated.   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 Reviewed results of abdominal CT done in ER 12/11/2018, showing 0.8 by 0.6 cm hypodense lesion in the tail the pancreas, with mild prominence of the dorsal pancreatic duct. The appearance is not entirely specific given the very small size of the lesion, although possibilities include a postinflammatory cyst or small intraductal papillary mucinous neoplasm. Will get CT of abdomen and pelvis for further evaluation and refer as indicated.    3. Cyst of pancreas Patient having intermittent, severe abdominal pain. Reviewed results of abdominal CT done in ER 12/11/2018, showing 0.8 by 0.6 cm hypodense lesion in the tail the pancreas, with mild prominence of the dorsal pancreatic duct. The appearance is not entirely specific given the very small size of the lesion, although possibilities include a postinflammatory cyst or small intraductal papillary mucinous neoplasm. Will get CT of  abdomen and pelvis for further evaluation and refer as indicated.   CT Abdomen Pelvis W Contrast; Future  4. Essential hypertension Stable. Continue bp medication as prescribed   General Counseling: ercie plank understanding of the findings of todays visit and agrees with plan of treatment. I have discussed any further diagnostic evaluation that may be needed or ordered today. We also reviewed her medications today. she has been encouraged to call the office with any questions or concerns that should arise related to todays visit.  This patient was seen by Leretha Pol FNP Collaboration with Dr Lavera Guise as a part of collaborative care agreement  Orders Placed This Encounter  Procedures  . CT Abdomen Pelvis W Contrast      Total time spent: 30 Minutes   Time spent includes review of chart, medications, test results, and follow up plan with the patient.      Dr Lavera Guise Internal medicine

## 2020-02-02 LAB — CBC
Hematocrit: 39.4 % (ref 34.0–46.6)
Hemoglobin: 12.6 g/dL (ref 11.1–15.9)
MCH: 26.7 pg (ref 26.6–33.0)
MCHC: 32 g/dL (ref 31.5–35.7)
MCV: 84 fL (ref 79–97)
Platelets: 228 10*3/uL (ref 150–450)
RBC: 4.72 x10E6/uL (ref 3.77–5.28)
RDW: 13.4 % (ref 11.7–15.4)
WBC: 3.1 10*3/uL — ABNORMAL LOW (ref 3.4–10.8)

## 2020-02-02 LAB — COMPREHENSIVE METABOLIC PANEL
ALT: 6 IU/L (ref 0–32)
AST: 16 IU/L (ref 0–40)
Albumin/Globulin Ratio: 1.7 (ref 1.2–2.2)
Albumin: 4.5 g/dL (ref 3.7–4.7)
Alkaline Phosphatase: 74 IU/L (ref 39–117)
BUN/Creatinine Ratio: 11 — ABNORMAL LOW (ref 12–28)
BUN: 16 mg/dL (ref 8–27)
Bilirubin Total: 0.3 mg/dL (ref 0.0–1.2)
CO2: 28 mmol/L (ref 20–29)
Calcium: 9.3 mg/dL (ref 8.7–10.3)
Chloride: 100 mmol/L (ref 96–106)
Creatinine, Ser: 1.41 mg/dL — ABNORMAL HIGH (ref 0.57–1.00)
GFR calc Af Amer: 41 mL/min/{1.73_m2} — ABNORMAL LOW (ref >59)
GFR calc non Af Amer: 36 mL/min/{1.73_m2} — ABNORMAL LOW (ref >59)
Globulin, Total: 2.7 g/dL (ref 1.5–4.5)
Glucose: 74 mg/dL (ref 65–99)
Potassium: 3.5 mmol/L (ref 3.5–5.2)
Sodium: 142 mmol/L (ref 134–144)
Total Protein: 7.2 g/dL (ref 6.0–8.5)

## 2020-02-02 LAB — B12 AND FOLATE PANEL
Folate: 3.4 ng/mL (ref >3.0)
Vitamin B-12: 1753 pg/mL — ABNORMAL HIGH (ref 232–1245)

## 2020-02-02 LAB — IRON AND TIBC
Iron Saturation: 22 % (ref 15–55)
Iron: 48 ug/dL (ref 27–139)
Total Iron Binding Capacity: 214 ug/dL — ABNORMAL LOW (ref 250–450)
UIBC: 166 ug/dL (ref 118–369)

## 2020-02-02 LAB — VITAMIN D 25 HYDROXY (VIT D DEFICIENCY, FRACTURES): Vit D, 25-Hydroxy: 7.3 ng/mL — ABNORMAL LOW (ref 30.0–100.0)

## 2020-02-02 LAB — FERRITIN: Ferritin: 244 ng/mL — ABNORMAL HIGH (ref 15–150)

## 2020-02-02 LAB — AMYLASE: Amylase: 97 U/L (ref 31–110)

## 2020-02-10 DIAGNOSIS — R1084 Generalized abdominal pain: Secondary | ICD-10-CM | POA: Insufficient documentation

## 2020-02-10 DIAGNOSIS — D378 Neoplasm of uncertain behavior of other specified digestive organs: Secondary | ICD-10-CM | POA: Insufficient documentation

## 2020-02-10 DIAGNOSIS — K862 Cyst of pancreas: Secondary | ICD-10-CM | POA: Insufficient documentation

## 2020-02-12 ENCOUNTER — Other Ambulatory Visit: Payer: Self-pay | Admitting: Nurse Practitioner

## 2020-02-12 DIAGNOSIS — R1084 Generalized abdominal pain: Secondary | ICD-10-CM

## 2020-02-13 ENCOUNTER — Other Ambulatory Visit: Payer: Self-pay

## 2020-02-13 ENCOUNTER — Ambulatory Visit
Admission: RE | Admit: 2020-02-13 | Discharge: 2020-02-13 | Disposition: A | Discharge status: Home / Self Care | Payer: Medicare Other | Source: Ambulatory Visit | Attending: Nurse Practitioner | Admitting: Nurse Practitioner

## 2020-02-13 DIAGNOSIS — K862 Cyst of pancreas: Secondary | ICD-10-CM | POA: Diagnosis not present

## 2020-02-13 MED ORDER — IOHEXOL 300 MG/ML  SOLN
75.0000 mL | Freq: Once | INTRAMUSCULAR | Status: AC | PRN
  Administered 2020-02-13: 75 mL via INTRAVENOUS

## 2020-02-14 NOTE — Progress Notes (Signed)
Stable. 18mm cyst of the pancreas. Discuss with patient at next visit 03/12/2020

## 2020-02-14 NOTE — Progress Notes (Signed)
Abnormal renal functions. Very low vitamin d. Elevated ferritin levels. Ct abdomen and pelvis unremarkable. Will discuss with patient at visit 03/12/2020

## 2020-02-15 ENCOUNTER — Other Ambulatory Visit: Payer: Self-pay | Admitting: Adult Health

## 2020-02-19 ENCOUNTER — Telehealth: Payer: Self-pay

## 2020-02-19 NOTE — Telephone Encounter (Signed)
Ct of the abdomen shows stability of 72mm cyst of the pancreas. Due to the stability, recommending a follow up CT in 14 months. She has a very small, not measurable, renal stone, which is non obstructing in right kidney. Other structures all within normal limits. There is some plaque in the abodminal aorta, which we will discuss further at ner follow up visit. Thanks.

## 2020-02-19 NOTE — Telephone Encounter (Signed)
Pt daughter was notified

## 2020-03-08 ENCOUNTER — Telehealth: Payer: Self-pay

## 2020-03-08 NOTE — Telephone Encounter (Signed)
Lmom to confirm and screen for 03-12-20 ov.

## 2020-03-12 ENCOUNTER — Encounter: Payer: Self-pay | Admitting: Nurse Practitioner

## 2020-03-12 ENCOUNTER — Other Ambulatory Visit: Payer: Self-pay

## 2020-03-12 ENCOUNTER — Ambulatory Visit (INDEPENDENT_AMBULATORY_CARE_PROVIDER_SITE_OTHER): Payer: Medicare Other | Admitting: Nurse Practitioner

## 2020-03-12 VITALS — BP 146/98 | HR 77 | Temp 97.0°F | Resp 16 | Ht 64.0 in | Wt 89.4 lb

## 2020-03-12 DIAGNOSIS — R300 Dysuria: Secondary | ICD-10-CM

## 2020-03-12 DIAGNOSIS — I10 Essential (primary) hypertension: Secondary | ICD-10-CM

## 2020-03-12 DIAGNOSIS — I700 Atherosclerosis of aorta: Secondary | ICD-10-CM | POA: Diagnosis not present

## 2020-03-12 DIAGNOSIS — D378 Neoplasm of uncertain behavior of other specified digestive organs: Secondary | ICD-10-CM

## 2020-03-12 DIAGNOSIS — E559 Vitamin D deficiency, unspecified: Secondary | ICD-10-CM | POA: Diagnosis not present

## 2020-03-12 DIAGNOSIS — Z0001 Encounter for general adult medical examination with abnormal findings: Secondary | ICD-10-CM | POA: Insufficient documentation

## 2020-03-12 MED ORDER — ERGOCALCIFEROL 1.25 MG (50000 UT) PO CAPS: 50000.0000 [IU] | ORAL_CAPSULE | ORAL | 5 refills | Status: AC

## 2020-03-12 NOTE — Progress Notes (Signed)
[facility] [place], [place] [address]  Internal MEDICINE  Office Visit Note  Patient Name: Joanna Aguilar  [id number]  [id number]  Date of Service: 03/12/2020  Chief Complaint  Patient presents with  . Medicare Wellness    follow up on CT scan  . Hypertension  . Hypothyroidism     The patient is here for health maintenance exam. She did have labs done prior to this visit. Renal functions were elevated. White blood cell count was a little low at 3.1. She did have CT scan of her abdomen. She has 53mm cyst on her pancreas. This is stable from previous imaging. Another scan is recommended in 14 months. She also has very small kidney stone in upper pole of her right kidney. There was atherosclerosis present in aortic arch. She denies chest pain, chest pressure, or shortness of breath.   Pt is here for routine health maintenance examination  Current Medication: Outpatient Encounter Medications as of 03/12/2020  Medication Sig  . amLODipine (NORVASC) 5 MG tablet TAKE 1 TABLET BY MOUTH EVERY MORNING FOR HTN  . donepezil (ARICEPT) 10 MG tablet TAKE ONE TABLET BY MOUTH ONCE DAILY IN THE MORNING FOR MEMORY  . memantine (NAMENDA) 5 MG tablet TAKE 1 TABLET BY MOUTH EVERY DAY FOR MEMORY  . ondansetron (ZOFRAN ODT) 4 MG disintegrating tablet Take 1 tablet (4 mg total) by mouth every 8 (eight) hours as needed.  Marland Kitchen SYNTHROID 88 MCG tablet Take 88 mcg by mouth daily.  Marland Kitchen triamcinolone cream (KENALOG) 0.1 % Apply 1 application topically 2 (two) times daily.  . ergocalciferol (DRISDOL) 1.25 MG (50000 UT) capsule Take 1 capsule (50,000 Units total) by mouth once a week.  . [DISCONTINUED] olmesartan (BENICAR) 20 MG tablet Take 1 tablet (20 mg total) by mouth daily. (Patient not taking: Reported on 03/12/2020)   No facility-administered encounter medications on file as of 03/12/2020.    Surgical History: Past Surgical History:  Procedure Laterality Date  . ABDOMINAL  HYSTERECTOMY      Medical History: Past Medical History:  Diagnosis Date  . Dementia ([place])   . Hypertension   . Thyroid disease     Family History: Family History  Problem Relation Age of Onset  . Diabetes Neg Hx   . Arthritis Neg Hx       Review of Systems  Constitutional: Negative for activity change, chills, fatigue and unexpected weight change.  HENT: Negative for congestion, rhinorrhea, sneezing and sore throat.   Respiratory: Negative for cough, chest tightness, shortness of breath and wheezing.   Cardiovascular: Negative for chest pain and palpitations.       Blood pressure slightly elevated .  Gastrointestinal: Positive for abdominal pain and nausea. Negative for constipation, diarrhea and vomiting.       Improved abdominal pain.   Endocrine: Negative for cold intolerance, heat intolerance, polydipsia and polyuria.       Patient sees endocrinologist routinely for management of thyrod disease.   Genitourinary: Negative for dysuria, frequency and urgency.  Musculoskeletal: Negative for arthralgias, back pain, joint swelling and neck pain.  Skin: Negative for rash.  Allergic/Immunologic: Negative for environmental allergies.  Neurological: Negative for dizziness, tremors, numbness and headaches.  Hematological: Negative for adenopathy. Does not bruise/bleed easily.  Psychiatric/Behavioral: Negative for behavioral problems and sleep disturbance. The patient is not nervous/anxious.     Today's Vitals   03/12/20 0852  BP: (!) 146/98  Pulse: 77  Resp: 16  Temp: (!) 97 F (36.1  C)  SpO2: 99%  Weight: 89 lb 6.4 oz (40.6 kg)   Body mass index is 15.35 kg/m.  Physical Exam Vitals and nursing note reviewed.  Constitutional:      General: She is not in acute distress.    Appearance: Normal appearance. She is well-developed. She is not diaphoretic.  HENT:     Head: Normocephalic and atraumatic.     Nose: Nose normal.     Mouth/Throat:     Pharynx: No  oropharyngeal exudate.  Eyes:     Pupils: Pupils are equal, round, and reactive to light.  Neck:     Thyroid: No thyromegaly.     Vascular: No carotid bruit or JVD.     Trachea: No tracheal deviation.  Cardiovascular:     Rate and Rhythm: Normal rate and regular rhythm.     Pulses: Normal pulses.     Heart sounds: Normal heart sounds. No murmur. No friction rub. No gallop.   Pulmonary:     Effort: Pulmonary effort is normal. No respiratory distress.     Breath sounds: Normal breath sounds. No wheezing or rales.  Chest:     Chest wall: No tenderness.  Abdominal:     General: Bowel sounds are normal.     Palpations: Abdomen is soft.     Tenderness: There is no abdominal tenderness.  Musculoskeletal:        General: Normal range of motion.     Cervical back: Normal range of motion and neck supple.  Lymphadenopathy:     Cervical: No cervical adenopathy.  Skin:    General: Skin is warm and dry.     Capillary Refill: Capillary refill takes less than 2 seconds.  Neurological:     Mental Status: She is alert and oriented to person, place, and time. Mental status is at baseline.     Cranial Nerves: No cranial nerve deficit.  Psychiatric:        Behavior: Behavior normal.        Thought Content: Thought content normal.        Judgment: Judgment normal.    Depression screen [facility] 2/9 03/12/2020 02/01/2020 02/28/2019 12/13/2018 08/08/2018  Decreased Interest 0 0 0 0 0  Down, Depressed, Hopeless 0 0 0 0 0  PHQ - 2 Score 0 0 0 0 0    Functional Status Survey: Is the patient deaf or have difficulty hearing?: No Does the patient have difficulty seeing, even when wearing glasses/contacts?: No Does the patient have difficulty concentrating, remembering, or making decisions?: Yes(memory issues) Does the patient have difficulty walking or climbing stairs?: No Does the patient have difficulty dressing or bathing?: No Does the patient have difficulty doing errands alone such as visiting a  doctor's office or shopping?: Yes  MMSE - [place] Exam 03/12/2020 06/08/2018  Not completed: Unable to complete -  Orientation to time - 0  Orientation to Place - 0  Registration - 3  Attention/ Calculation - 5  Recall - 3  Language- name 2 objects - 2  Language- repeat - 1  Language- follow 3 step command - 3  Language- read & follow direction - 1  Write a sentence - 1  Copy design - 1  Total score - 20    Fall Risk  03/12/2020 02/01/2020 02/28/2019 12/13/2018 08/08/2018  Falls in the past year? 0 0 0 0 No  Number falls in past yr: - - - 0 -  Injury with Fall? - - - 0 -  LABS: Recent Results (from the past 2160 hour(s))  Comprehensive metabolic panel     Status: Abnormal   Collection Time: 02/01/20 11:52 AM  Result Value Ref Range   Glucose 74 65 - 99 mg/dL   BUN 16 8 - 27 mg/dL   Creatinine, Ser 1.41 (H) 0.57 - 1.00 mg/dL   GFR calc non Af Amer 36 (L) >59 mL/min/1.73   GFR calc Af Amer 41 (L) >59 mL/min/1.73   BUN/Creatinine Ratio 11 (L) 12 - 28   Sodium 142 134 - 144 mmol/L   Potassium 3.5 3.5 - 5.2 mmol/L   Chloride 100 96 - 106 mmol/L   CO2 28 20 - 29 mmol/L   Calcium 9.3 8.7 - 10.3 mg/dL   Total Protein 7.2 6.0 - 8.5 g/dL   Albumin 4.5 3.7 - 4.7 g/dL   Globulin, Total 2.7 1.5 - 4.5 g/dL   Albumin/Globulin Ratio 1.7 1.2 - 2.2   Bilirubin Total 0.3 0.0 - 1.2 mg/dL   Alkaline Phosphatase 74 39 - 117 IU/L   AST 16 0 - 40 IU/L   ALT 6 0 - 32 IU/L  CBC     Status: Abnormal   Collection Time: 02/01/20 11:52 AM  Result Value Ref Range   WBC 3.1 (L) 3.4 - 10.8 x10E3/uL   RBC 4.72 3.77 - 5.28 x10E6/uL   Hemoglobin 12.6 11.1 - 15.9 g/dL   Hematocrit 39.4 34.0 - 46.6 %   MCV 84 79 - 97 fL   MCH 26.7 26.6 - 33.0 pg   MCHC 32.0 31.5 - 35.7 g/dL   RDW 13.4 11.7 - 15.4 %   Platelets 228 150 - 450 x10E3/uL  Iron and TIBC     Status: Abnormal   Collection Time: 02/01/20 11:52 AM  Result Value Ref Range   Total Iron Binding Capacity 214 (L) 250 - 450 ug/dL    UIBC 166 118 - 369 ug/dL   Iron 48 27 - 139 ug/dL   Iron Saturation 22 15 - 55 %  B12 and Folate Panel     Status: Abnormal   Collection Time: 02/01/20 11:52 AM  Result Value Ref Range   Vitamin B-12 1,753 (H) 232 - 1,245 pg/mL   Folate 3.4 >3.0 ng/mL    Comment: A serum folate concentration of less than 3.1 ng/mL is considered to represent clinical deficiency.   VITAMIN D 25 Hydroxy (Vit-D Deficiency, Fractures)     Status: Abnormal   Collection Time: 02/01/20 11:52 AM  Result Value Ref Range   Vit D, 25-Hydroxy 7.3 (L) 30.0 - 100.0 ng/mL    Comment: Vitamin D deficiency has been defined by the [place] practice guideline as a level of serum 25-OH vitamin D less than 20 ng/mL (1,2). The Endocrine Society went on to further define vitamin D insufficiency as a level between 21 and 29 ng/mL (2). 1. IOM (Institute of Medicine). 2010. Dietary reference    intakes for calcium and D. [place]: The    [employer]. 2. Holick MF, Binkley [place], Bischoff-Ferrari HA, et al.    Evaluation, treatment, and prevention of vitamin D    deficiency: an Endocrine Society clinical practice    guideline. JCEM. 2011 Jul; 96(7):1911-30.   Amylase     Status: None   Collection Time: 02/01/20 11:52 AM  Result Value Ref Range   Amylase 97 31 - 110 U/L  Ferritin     Status: Abnormal   Collection Time: 02/01/20  11:52 AM  Result Value Ref Range   Ferritin 244 (H) 15 - 150 ng/mL   Assessment/Plan:  1. Encounter for general adult medical examination with abnormal findings Annual health maintenance exam today  2. Aortic arch atherosclerosis (HCC) Reviewed CT abdomen and pelvis. There is atherosclerosis of aortic arch. Will get carotid doppler and ultrasound of abdominal vasculature for further evaluation.  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 Shows stable, 10mm cyst of pancreas. Will continue to monitor yearly.  4. Essential hypertension Generally stable. Continue bp medication as prescribed. Monitor closely at home.   5. Vitamin D deficiency - ergocalciferol (DRISDOL) 1.25 MG (50000 UT) capsule; Take 1 capsule (50,000 Units total) by mouth once a week.  Dispense: 4 capsule; Refill: 5  6. Dysuria U/a ordered today.  General Counseling: saije socarras understanding of the findings of todays visit and agrees with plan of treatment. I have discussed any further diagnostic evaluation that may be needed or ordered today. We also reviewed her medications today. she has been encouraged to call the office with any questions or concerns that should arise related to todays visit.    Counseling:  This patient was seen by Leretha Pol FNP Collaboration with Dr Lavera Guise as a part of collaborative care agreement  Orders Placed This Encounter  Procedures  . US Carotid Duplex Bilateral  . VAS US AORTA MEDICARE SCREEN    Meds ordered this encounter  Medications  . ergocalciferol (DRISDOL) 1.25 MG (50000 UT) capsule    Sig: Take 1 capsule (50,000 Units total) by mouth once a week.    Dispense:  4 capsule    Refill:  5    Order Specific Question:   Supervising Provider    Answer:   Lavera Guise [id number]    Total time spent: 3 Minutes  Time spent includes review of chart, medications, test results, and follow up plan with the patient.     Lavera Guise, MD  Internal Medicine

## 2020-03-14 ENCOUNTER — Telehealth: Payer: Self-pay

## 2020-03-14 NOTE — Telephone Encounter (Signed)
CONFIRMED PATIENT ULTRASOUND APPT FOR 03/15/20

## 2020-03-15 ENCOUNTER — Ambulatory Visit: Payer: Medicare Other

## 2020-03-15 ENCOUNTER — Other Ambulatory Visit: Payer: Self-pay

## 2020-03-15 ENCOUNTER — Ambulatory Visit: Payer: Medicare Other | Admitting: Nurse Practitioner

## 2020-03-15 DIAGNOSIS — E890 Postprocedural hypothyroidism: Secondary | ICD-10-CM | POA: Diagnosis not present

## 2020-03-15 DIAGNOSIS — I700 Atherosclerosis of aorta: Secondary | ICD-10-CM

## 2020-03-15 DIAGNOSIS — I6523 Occlusion and stenosis of bilateral carotid arteries: Secondary | ICD-10-CM | POA: Diagnosis not present

## 2020-03-15 DIAGNOSIS — I10 Essential (primary) hypertension: Secondary | ICD-10-CM | POA: Diagnosis not present

## 2020-03-19 DIAGNOSIS — E890 Postprocedural hypothyroidism: Secondary | ICD-10-CM | POA: Diagnosis not present

## 2020-03-19 DIAGNOSIS — I10 Essential (primary) hypertension: Secondary | ICD-10-CM | POA: Diagnosis not present

## 2020-03-19 DIAGNOSIS — E042 Nontoxic multinodular goiter: Secondary | ICD-10-CM | POA: Diagnosis not present

## 2020-03-19 DIAGNOSIS — E0500 Thyrotoxicosis with diffuse goiter without thyrotoxic crisis or storm: Secondary | ICD-10-CM | POA: Diagnosis not present

## 2020-03-19 DIAGNOSIS — M818 Other osteoporosis without current pathological fracture: Secondary | ICD-10-CM | POA: Diagnosis not present

## 2020-03-19 DIAGNOSIS — R600 Localized edema: Secondary | ICD-10-CM | POA: Diagnosis not present

## 2020-03-24 NOTE — Procedures (Signed)
[place], [place] [address]  DATE OF SERVICE: March 15, 2020  CAROTID DOPPLER INTERPRETATION:  Bilateral Carotid Ultrsasound and Color Doppler Examination was performed. The RIGHT CCA shows mild plaque in the vessel. The LEFT CCA shows mild plaque in the vessel. There was no significant intimal thickening noted in the RIGHT carotid artery. There was no significant intimal thickening in the LEFT carotid artery.  The RIGHT CCA shows peak systolic velocity of 42 cm per second. The end diastolic velocity is 8 cm per second on the RIGHT side. The RIGHT ICA shows peak systolic velocity of [id number] per second. RIGHT sided ICA end diastolic velocity is 50 cm per second. The RIGHT ECA shows a peak systolic velocity of 45 cm per second. The ICA/CCA ratio is calculated to be 3.5. This suggests 50 to 69% stenosis in the vessel. The Vertebral Artery shows antegrade flow.  The LEFT CCA shows peak systolic velocity of 54 cm per second. The end diastolic velocity is 17 cm per second on the LEFT side. The LEFT ICA shows peak systolic velocity of [id number] per second. LEFT sided ICA end diastolic velocity is 44 cm per second. The LEFT ECA shows a peak systolic velocity of 50 cm per second. The ICA/CCA ratio is calculated to be 2.7. This suggests 50 to 69% stenosis in the vessel. The Vertebral Artery shows antegrade flow.   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 The LEFT CAROTID shows 50 to 69% stenosis of the vessel.  There is mild plaque formation noted on the LEFT and mild plaque noted on the RIGHT  side. Consider a repeat Carotid doppler if clinical situation and symptoms warrant in 6-12 months. Patient should be encouraged to change lifestyles such as smoking cessation, regular exercise and dietary modification. Use of statins in the right clinical setting and ASA is encouraged.  Allyne Gee, MD [facility] Critical Care Medicine

## 2020-03-25 NOTE — Progress Notes (Signed)
Mild plaque with <50% stenosis, bilaterally. Review at visit 04/19/2020

## 2020-03-28 DIAGNOSIS — R600 Localized edema: Secondary | ICD-10-CM | POA: Diagnosis not present

## 2020-03-28 DIAGNOSIS — I10 Essential (primary) hypertension: Secondary | ICD-10-CM | POA: Diagnosis not present

## 2020-03-28 DIAGNOSIS — E0500 Thyrotoxicosis with diffuse goiter without thyrotoxic crisis or storm: Secondary | ICD-10-CM | POA: Diagnosis not present

## 2020-03-28 DIAGNOSIS — E042 Nontoxic multinodular goiter: Secondary | ICD-10-CM | POA: Diagnosis not present

## 2020-03-28 DIAGNOSIS — M818 Other osteoporosis without current pathological fracture: Secondary | ICD-10-CM | POA: Diagnosis not present

## 2020-03-28 DIAGNOSIS — E890 Postprocedural hypothyroidism: Secondary | ICD-10-CM | POA: Diagnosis not present

## 2020-04-10 ENCOUNTER — Telehealth: Payer: Self-pay

## 2020-04-10 NOTE — Telephone Encounter (Signed)
Called lmom informing patient of appointment on 04/12/2020. klh

## 2020-04-12 ENCOUNTER — Other Ambulatory Visit: Payer: Medicare Other

## 2020-04-19 ENCOUNTER — Ambulatory Visit: Payer: Medicare Other | Admitting: Nurse Practitioner

## 2020-04-23 ENCOUNTER — Other Ambulatory Visit: Payer: Self-pay

## 2020-04-23 MED ORDER — AMLODIPINE BESYLATE 5 MG PO TABS: ORAL_TABLET | ORAL | 2 refills | Status: AC

## 2020-05-07 ENCOUNTER — Telehealth: Payer: Self-pay

## 2020-05-07 NOTE — Telephone Encounter (Signed)
Patient cancelled appointment on 05/10/2020 and 05/17/2020. klh

## 2020-05-10 ENCOUNTER — Other Ambulatory Visit: Payer: Medicare Other

## 2020-05-16 ENCOUNTER — Encounter: Payer: Self-pay | Admitting: Emergency Medicine

## 2020-05-16 ENCOUNTER — Inpatient Hospital Stay
Admission: EM | Admit: 2020-05-16 | Discharge: 2020-05-22 | DRG: 522 | Disposition: A | Discharge status: Skilled Nursing Facility | Payer: Medicare Other | Attending: Internal Medicine | Admitting: Internal Medicine

## 2020-05-16 ENCOUNTER — Other Ambulatory Visit: Payer: Self-pay

## 2020-05-16 ENCOUNTER — Emergency Department: Payer: Medicare Other

## 2020-05-16 DIAGNOSIS — M25572 Pain in left ankle and joints of left foot: Secondary | ICD-10-CM | POA: Diagnosis not present

## 2020-05-16 DIAGNOSIS — D631 Anemia in chronic kidney disease: Secondary | ICD-10-CM | POA: Diagnosis not present

## 2020-05-16 DIAGNOSIS — R262 Difficulty in walking, not elsewhere classified: Secondary | ICD-10-CM | POA: Diagnosis not present

## 2020-05-16 DIAGNOSIS — S72001D Fracture of unspecified part of neck of right femur, subsequent encounter for closed fracture with routine healing: Secondary | ICD-10-CM | POA: Diagnosis not present

## 2020-05-16 DIAGNOSIS — R339 Retention of urine, unspecified: Secondary | ICD-10-CM | POA: Diagnosis not present

## 2020-05-16 DIAGNOSIS — S0990XA Unspecified injury of head, initial encounter: Secondary | ICD-10-CM | POA: Diagnosis not present

## 2020-05-16 DIAGNOSIS — R404 Transient alteration of awareness: Secondary | ICD-10-CM | POA: Diagnosis not present

## 2020-05-16 DIAGNOSIS — R9082 White matter disease, unspecified: Secondary | ICD-10-CM | POA: Diagnosis not present

## 2020-05-16 DIAGNOSIS — Z96641 Presence of right artificial hip joint: Secondary | ICD-10-CM | POA: Diagnosis not present

## 2020-05-16 DIAGNOSIS — R54 Age-related physical debility: Secondary | ICD-10-CM | POA: Diagnosis present

## 2020-05-16 DIAGNOSIS — E039 Hypothyroidism, unspecified: Secondary | ICD-10-CM | POA: Diagnosis not present

## 2020-05-16 DIAGNOSIS — M6281 Muscle weakness (generalized): Secondary | ICD-10-CM | POA: Diagnosis not present

## 2020-05-16 DIAGNOSIS — N183 Chronic kidney disease, stage 3 unspecified: Secondary | ICD-10-CM | POA: Diagnosis present

## 2020-05-16 DIAGNOSIS — N1831 Chronic kidney disease, stage 3a: Secondary | ICD-10-CM | POA: Diagnosis not present

## 2020-05-16 DIAGNOSIS — Z20822 Contact with and (suspected) exposure to covid-19: Secondary | ICD-10-CM | POA: Diagnosis not present

## 2020-05-16 DIAGNOSIS — R32 Unspecified urinary incontinence: Secondary | ICD-10-CM | POA: Diagnosis not present

## 2020-05-16 DIAGNOSIS — Z79899 Other long term (current) drug therapy: Secondary | ICD-10-CM

## 2020-05-16 DIAGNOSIS — K5900 Constipation, unspecified: Secondary | ICD-10-CM | POA: Diagnosis not present

## 2020-05-16 DIAGNOSIS — E890 Postprocedural hypothyroidism: Secondary | ICD-10-CM | POA: Diagnosis not present

## 2020-05-16 DIAGNOSIS — I7090 Unspecified atherosclerosis: Secondary | ICD-10-CM | POA: Diagnosis not present

## 2020-05-16 DIAGNOSIS — Z880 Allergy status to penicillin: Secondary | ICD-10-CM

## 2020-05-16 DIAGNOSIS — D62 Acute posthemorrhagic anemia: Secondary | ICD-10-CM | POA: Diagnosis not present

## 2020-05-16 DIAGNOSIS — W1830XA Fall on same level, unspecified, initial encounter: Secondary | ICD-10-CM | POA: Diagnosis present

## 2020-05-16 DIAGNOSIS — Z471 Aftercare following joint replacement surgery: Secondary | ICD-10-CM | POA: Diagnosis not present

## 2020-05-16 DIAGNOSIS — R279 Unspecified lack of coordination: Secondary | ICD-10-CM | POA: Diagnosis not present

## 2020-05-16 DIAGNOSIS — Z4789 Encounter for other orthopedic aftercare: Secondary | ICD-10-CM | POA: Diagnosis not present

## 2020-05-16 DIAGNOSIS — E876 Hypokalemia: Secondary | ICD-10-CM | POA: Diagnosis not present

## 2020-05-16 DIAGNOSIS — I10 Essential (primary) hypertension: Secondary | ICD-10-CM | POA: Diagnosis not present

## 2020-05-16 DIAGNOSIS — D649 Anemia, unspecified: Secondary | ICD-10-CM | POA: Diagnosis not present

## 2020-05-16 DIAGNOSIS — S72001A Fracture of unspecified part of neck of right femur, initial encounter for closed fracture: Principal | ICD-10-CM | POA: Diagnosis present

## 2020-05-16 DIAGNOSIS — M25551 Pain in right hip: Secondary | ICD-10-CM | POA: Diagnosis not present

## 2020-05-16 DIAGNOSIS — I129 Hypertensive chronic kidney disease with stage 1 through stage 4 chronic kidney disease, or unspecified chronic kidney disease: Secondary | ICD-10-CM | POA: Diagnosis present

## 2020-05-16 DIAGNOSIS — S72009A Fracture of unspecified part of neck of unspecified femur, initial encounter for closed fracture: Secondary | ICD-10-CM

## 2020-05-16 DIAGNOSIS — S72031A Displaced midcervical fracture of right femur, initial encounter for closed fracture: Secondary | ICD-10-CM | POA: Diagnosis not present

## 2020-05-16 DIAGNOSIS — R2681 Unsteadiness on feet: Secondary | ICD-10-CM | POA: Diagnosis not present

## 2020-05-16 DIAGNOSIS — Z79890 Hormone replacement therapy (postmenopausal): Secondary | ICD-10-CM | POA: Diagnosis not present

## 2020-05-16 DIAGNOSIS — W19XXXD Unspecified fall, subsequent encounter: Secondary | ICD-10-CM | POA: Diagnosis not present

## 2020-05-16 DIAGNOSIS — Z743 Need for continuous supervision: Secondary | ICD-10-CM | POA: Diagnosis not present

## 2020-05-16 DIAGNOSIS — D378 Neoplasm of uncertain behavior of other specified digestive organs: Secondary | ICD-10-CM | POA: Diagnosis not present

## 2020-05-16 DIAGNOSIS — R6889 Other general symptoms and signs: Secondary | ICD-10-CM | POA: Diagnosis not present

## 2020-05-16 DIAGNOSIS — Z03818 Encounter for observation for suspected exposure to other biological agents ruled out: Secondary | ICD-10-CM | POA: Diagnosis not present

## 2020-05-16 DIAGNOSIS — Z90710 Acquired absence of both cervix and uterus: Secondary | ICD-10-CM | POA: Diagnosis not present

## 2020-05-16 DIAGNOSIS — Z66 Do not resuscitate: Secondary | ICD-10-CM | POA: Diagnosis not present

## 2020-05-16 DIAGNOSIS — W19XXXA Unspecified fall, initial encounter: Secondary | ICD-10-CM

## 2020-05-16 DIAGNOSIS — Z01818 Encounter for other preprocedural examination: Secondary | ICD-10-CM | POA: Diagnosis not present

## 2020-05-16 DIAGNOSIS — Y92009 Unspecified place in unspecified non-institutional (private) residence as the place of occurrence of the external cause: Secondary | ICD-10-CM

## 2020-05-16 DIAGNOSIS — S199XXA Unspecified injury of neck, initial encounter: Secondary | ICD-10-CM | POA: Diagnosis not present

## 2020-05-16 DIAGNOSIS — R609 Edema, unspecified: Secondary | ICD-10-CM | POA: Diagnosis not present

## 2020-05-16 DIAGNOSIS — F0390 Unspecified dementia without behavioral disturbance: Secondary | ICD-10-CM | POA: Diagnosis present

## 2020-05-16 LAB — COMPREHENSIVE METABOLIC PANEL
ALT: 14 U/L (ref 0–44)
AST: 25 U/L (ref 15–41)
Albumin: 4 g/dL (ref 3.5–5.0)
Alkaline Phosphatase: 58 U/L (ref 38–126)
Anion gap: 13 (ref 5–15)
BUN: 21 mg/dL (ref 8–23)
CO2: 25 mmol/L (ref 22–32)
Calcium: 9.1 mg/dL (ref 8.9–10.3)
Chloride: 101 mmol/L (ref 98–111)
Creatinine, Ser: 1.15 mg/dL — ABNORMAL HIGH (ref 0.44–1.00)
GFR calc Af Amer: 54 mL/min — ABNORMAL LOW (ref >60)
GFR calc non Af Amer: 46 mL/min — ABNORMAL LOW (ref >60)
Glucose, Bld: 144 mg/dL — ABNORMAL HIGH (ref 70–99)
Potassium: 3 mmol/L — ABNORMAL LOW (ref 3.5–5.1)
Sodium: 139 mmol/L (ref 135–145)
Total Bilirubin: 0.9 mg/dL (ref 0.3–1.2)
Total Protein: 7.7 g/dL (ref 6.5–8.1)

## 2020-05-16 LAB — CBC WITH DIFFERENTIAL/PLATELET
Abs Immature Granulocytes: 0.05 10*3/uL (ref 0.00–0.07)
Basophils Absolute: 0 10*3/uL (ref 0.0–0.1)
Basophils Relative: 0 %
Eosinophils Absolute: 0 10*3/uL (ref 0.0–0.5)
Eosinophils Relative: 0 %
HCT: 31.9 % — ABNORMAL LOW (ref 36.0–46.0)
Hemoglobin: 10.7 g/dL — ABNORMAL LOW (ref 12.0–15.0)
Immature Granulocytes: 1 %
Lymphocytes Relative: 10 %
Lymphs Abs: 0.7 10*3/uL (ref 0.7–4.0)
MCH: 26.9 pg (ref 26.0–34.0)
MCHC: 33.5 g/dL (ref 30.0–36.0)
MCV: 80.2 fL (ref 80.0–100.0)
Monocytes Absolute: 0.6 10*3/uL (ref 0.1–1.0)
Monocytes Relative: 9 %
Neutro Abs: 5.4 10*3/uL (ref 1.7–7.7)
Neutrophils Relative %: 80 %
Platelets: 162 10*3/uL (ref 150–400)
RBC: 3.98 MIL/uL (ref 3.87–5.11)
RDW: 14.2 % (ref 11.5–15.5)
WBC: 6.7 10*3/uL (ref 4.0–10.5)
nRBC: 0 % (ref 0.0–0.2)

## 2020-05-16 LAB — PROTIME-INR
INR: 0.9 (ref 0.8–1.2)
Prothrombin Time: 11.9 seconds (ref 11.4–15.2)

## 2020-05-16 LAB — APTT: aPTT: 27 seconds (ref 24–36)

## 2020-05-16 LAB — SARS CORONAVIRUS 2 BY RT PCR (HOSPITAL ORDER, PERFORMED IN ~~LOC~~ HOSPITAL LAB): SARS Coronavirus 2: NEGATIVE

## 2020-05-16 LAB — TYPE AND SCREEN
ABO/RH(D): B POS
Antibody Screen: NEGATIVE

## 2020-05-16 MED ORDER — LEVOTHYROXINE SODIUM 88 MCG PO TABS
88.0000 ug | ORAL_TABLET | Freq: Every day | ORAL | Status: DC
  Administered 2020-05-18 – 2020-05-22 (×4): 88 ug via ORAL
  Filled 2020-05-16 (×6): qty 1

## 2020-05-16 MED ORDER — HYDROMORPHONE HCL 1 MG/ML IJ SOLN
0.5000 mg | Freq: Once | INTRAMUSCULAR | Status: AC
  Administered 2020-05-16: 0.5 mg via INTRAVENOUS
  Filled 2020-05-16: qty 1

## 2020-05-16 MED ORDER — ACETAMINOPHEN 325 MG PO TABS
650.0000 mg | ORAL_TABLET | Freq: Four times a day (QID) | ORAL | Status: DC | PRN
  Administered 2020-05-18: 650 mg via ORAL
  Filled 2020-05-16: qty 2

## 2020-05-16 MED ORDER — ONDANSETRON HCL 4 MG/2ML IJ SOLN: 4.0000 mg | Freq: Three times a day (TID) | INTRAMUSCULAR | Status: DC | PRN

## 2020-05-16 MED ORDER — MORPHINE SULFATE (PF) 2 MG/ML IV SOLN
1.0000 mg | INTRAVENOUS | Status: DC | PRN
  Administered 2020-05-16 – 2020-05-17 (×2): 1 mg via INTRAVENOUS
  Filled 2020-05-16 (×2): qty 1

## 2020-05-16 MED ORDER — OXYCODONE-ACETAMINOPHEN 5-325 MG PO TABS
1.0000 | ORAL_TABLET | ORAL | Status: DC | PRN
  Administered 2020-05-17: 1 via ORAL
  Filled 2020-05-16: qty 1

## 2020-05-16 MED ORDER — AMLODIPINE BESYLATE 5 MG PO TABS
5.0000 mg | ORAL_TABLET | Freq: Every day | ORAL | Status: DC
  Administered 2020-05-16 – 2020-05-22 (×7): 5 mg via ORAL
  Filled 2020-05-16 (×7): qty 1

## 2020-05-16 MED ORDER — MUPIROCIN 2 % EX OINT
1.0000 "application " | TOPICAL_OINTMENT | Freq: Two times a day (BID) | CUTANEOUS | Status: AC
  Administered 2020-05-17 – 2020-05-21 (×9): 1 via NASAL
  Filled 2020-05-16: qty 22

## 2020-05-16 MED ORDER — DONEPEZIL HCL 5 MG PO TABS
10.0000 mg | ORAL_TABLET | Freq: Every day | ORAL | Status: DC
  Administered 2020-05-16 – 2020-05-22 (×7): 10 mg via ORAL
  Filled 2020-05-16 (×7): qty 2

## 2020-05-16 MED ORDER — SODIUM CHLORIDE 0.9 % IV SOLN: INTRAVENOUS | Status: DC

## 2020-05-16 MED ORDER — POTASSIUM CHLORIDE CRYS ER 20 MEQ PO TBCR
40.0000 meq | EXTENDED_RELEASE_TABLET | Freq: Once | ORAL | Status: AC
  Administered 2020-05-16: 40 meq via ORAL
  Filled 2020-05-16: qty 2

## 2020-05-16 MED ORDER — HYDRALAZINE HCL 20 MG/ML IJ SOLN
5.0000 mg | INTRAMUSCULAR | Status: DC | PRN
  Administered 2020-05-17: 5 mg via INTRAVENOUS
  Filled 2020-05-16 (×2): qty 0.25

## 2020-05-16 MED ORDER — METHOCARBAMOL 500 MG PO TABS
500.0000 mg | ORAL_TABLET | Freq: Three times a day (TID) | ORAL | Status: DC | PRN
  Administered 2020-05-16: 500 mg via ORAL
  Filled 2020-05-16 (×2): qty 1

## 2020-05-16 MED ORDER — ONDANSETRON HCL 4 MG/2ML IJ SOLN
4.0000 mg | Freq: Once | INTRAMUSCULAR | Status: AC
  Administered 2020-05-16: 4 mg via INTRAVENOUS
  Filled 2020-05-16: qty 2

## 2020-05-16 MED ORDER — MEMANTINE HCL 5 MG PO TABS
5.0000 mg | ORAL_TABLET | Freq: Every day | ORAL | Status: DC
  Administered 2020-05-17 – 2020-05-22 (×6): 5 mg via ORAL
  Filled 2020-05-16 (×6): qty 1

## 2020-05-16 MED ORDER — SENNOSIDES-DOCUSATE SODIUM 8.6-50 MG PO TABS
1.0000 | ORAL_TABLET | Freq: Every evening | ORAL | Status: DC | PRN
  Administered 2020-05-16: 1 via ORAL
  Filled 2020-05-16: qty 1

## 2020-05-16 NOTE — H&P (Signed)
History and Physical    [name]:[id number] DOB: [birth year] DOA: 05/16/2020  Referring MD/NP/PA:   PCP: Lavera Guise, MD   Patient coming from:  The patient is coming from home.  At baseline, pt is partially dependent for most of ADL.        Chief Complaint: fall and right hip pain  HPI: Joanna Aguilar is a 79 y.o. female with medical history significant of hypertension, dementia, hypothyroidism, CKD-III, who presents with fall, right hip pain.  Per her daughter, pt fell when she attempted to climb the stairs to the attic and lost her balance yesterday. She injured her right hip, causing severe pain. Her right leg is obviously shortened and externally rotated.  Per her daughter, patient does not have chest pain, shortness breath, cough, fever or chills.  No nausea, vomiting, diarrhea, abdominal pain, symptoms of UTI.  No facial droop or slurred speech.  ED Course: pt was found to have negative Covid PCR, WBC 6.9, INR 0.9, PTT 29, potassium 3.0, renal function close to baseline, temperature normal, blood pressure 154/91, heart rate 71, RR 16, oxygen saturation 96% on room air.  CT of head and CT of C-spine negative.  X-ray of right hip/pelvis showed displaced proximal right femoral neck fracture.  Patient is admitted to [facility] bed as inpatient.  Orthopedic surgeon, Dr. Mack Guise is consulted.  Review of Systems:   General: no fevers, chills, no body weight gain, has fatigue HEENT: no blurry vision, hearing changes or sore throat Respiratory: no dyspnea, coughing, wheezing CV: no chest pain, no palpitations GI: no nausea, vomiting, abdominal pain, diarrhea, constipation GU: no dysuria, burning on urination, increased urinary frequency, hematuria  Ext: no leg edema Neuro: no unilateral weakness, numbness, or tingling, no vision change or hearing loss. Has fall. Skin: no rash, no skin tear. MSK: has right hip pain Heme: No easy bruising.  Travel history: No recent long  distant travel.  Allergy:  Allergies  Allergen Reactions  . Penicillins Rash    Past Medical History:  Diagnosis Date  . Dementia ([place])   . Hypertension   . Thyroid disease     Past Surgical History:  Procedure Laterality Date  . ABDOMINAL HYSTERECTOMY      Social History:  reports that she has never smoked. She has never used smokeless tobacco. She reports that she does not drink alcohol and does not use drugs.  Family History:  Family History  Problem Relation Age of Onset  . Diabetes Neg Hx   . Arthritis Neg Hx      Prior to Admission medications   Medication Sig Start Date End Date Taking? Authorizing Provider  amLODipine (NORVASC) 5 MG tablet TAKE 1 TABLET BY MOUTH EVERY MORNING FOR HTN 04/23/20   Boscia, Heather E, NP  donepezil (ARICEPT) 10 MG tablet TAKE ONE TABLET BY MOUTH ONCE DAILY IN THE MORNING FOR MEMORY 02/15/20   Ronnell Freshwater, NP  ergocalciferol (DRISDOL) 1.25 MG (50000 UT) capsule Take 1 capsule (50,000 Units total) by mouth once a week. 03/12/20   Boscia, Greer Ee, NP  memantine (NAMENDA) 5 MG tablet TAKE 1 TABLET BY MOUTH EVERY DAY FOR MEMORY 02/15/20   Ronnell Freshwater, NP  ondansetron (ZOFRAN ODT) 4 MG disintegrating tablet Take 1 tablet (4 mg total) by mouth every 8 (eight) hours as needed. 12/11/18   Rudene Re, MD  SYNTHROID 88 MCG tablet Take 88 mcg by mouth daily. 12/31/18   [provider]  triamcinolone cream (  KENALOG) 0.1 % Apply 1 application topically 2 (two) times daily.    [provider]    Physical Exam: Vitals:   05/16/20 1158 05/16/20 1202  BP: (!) 154/97 (!) 154/91  Pulse: 70 71  Resp: 17 16  Temp:  98.1 F (36.7 C)  TempSrc:  Oral  SpO2: 94% 96%  Weight:  36.3 kg  Height:  5\' 2"  (1.575 m)   General: Not in acute distress HEENT:       Eyes: PERRL, EOMI, no scleral icterus.       ENT: No discharge from the ears and nose, no pharynx injection, no tonsillar enlargement.        Neck: No JVD, no bruit,  no mass felt. Heme: No neck lymph node enlargement. Cardiac: S1/S2, RRR, No murmurs, No gallops or rubs. Respiratory: No rales, wheezing, rhonchi or rubs. GI: Soft, nondistended, nontender, no rebound pain, no organomegaly, BS present. GU: No hematuria Ext: No pitting leg edema bilaterally. 2+DP/PT pulse bilaterally. Musculoskeletal: Has tenderness in right hip.  Right leg is shortened and externally rotated. Skin: No rashes.  Neuro: Alert, oriented X3, cranial nerves II-XII grossly intact, moves all extremities Psych: Patient is not psychotic, no suicidal or hemocidal ideation.  Labs on Admission: I have personally reviewed following labs and imaging studies  CBC: Recent Labs  Lab 05/16/20 1130  WBC 6.7  NEUTROABS 5.4  HGB 10.7*  HCT 31.9*  MCV 80.2  PLT 010   Basic Metabolic Panel: Recent Labs  Lab 05/16/20 1130  NA 139  K 3.0*  CL 101  CO2 25  GLUCOSE 144*  BUN 21  CREATININE 1.15*  CALCIUM 9.1   GFR: Estimated Creatinine Clearance: 23.8 mL/min (A) (by C-G formula based on SCr of 1.15 mg/dL (H)). Liver Function Tests: Recent Labs  Lab 05/16/20 1130  AST 25  ALT 14  ALKPHOS 58  BILITOT 0.9  PROT 7.7  ALBUMIN 4.0   No results for input(s): LIPASE, AMYLASE in the last 168 hours. No results for input(s): AMMONIA in the last 168 hours. Coagulation Profile: Recent Labs  Lab 05/16/20 1130  INR 0.9   Cardiac Enzymes: No results for input(s): CKTOTAL, CKMB, CKMBINDEX, TROPONINI in the last 168 hours. BNP (last 3 results) No results for input(s): PROBNP in the last 8760 hours. HbA1C: No results for input(s): HGBA1C in the last 72 hours. CBG: No results for input(s): GLUCAP in the last 168 hours. Lipid Profile: No results for input(s): CHOL, HDL, LDLCALC, TRIG, CHOLHDL, LDLDIRECT in the last 72 hours. Thyroid Function Tests: No results for input(s): TSH, T4TOTAL, FREET4, T3FREE, THYROIDAB in the last 72 hours. Anemia Panel: No results for input(s):  VITAMINB12, FOLATE, FERRITIN, TIBC, IRON, RETICCTPCT in the last 72 hours. Urine analysis:    Component Value Date/Time   COLORURINE STRAW (A) 12/11/2018 1418   APPEARANCEUR CLEAR (A) 12/11/2018 1418   APPEARANCEUR Cloudy (A) 06/08/2018 1051   LABSPEC 1.009 12/11/2018 1418   PHURINE 7.0 12/11/2018 1418   GLUCOSEU NEGATIVE 12/11/2018 1418   HGBUR NEGATIVE 12/11/2018 [place] 12/11/2018 1418   BILIRUBINUR Negative 06/08/2018 1051   KETONESUR 5 (A) 12/11/2018 1418   PROTEINUR NEGATIVE 12/11/2018 1418   NITRITE NEGATIVE 12/11/2018 1418   LEUKOCYTESUR NEGATIVE 12/11/2018 1418   LEUKOCYTESUR 1+ (A) 06/08/2018 1051   Sepsis Labs: @LABRCNTIP (procalcitonin:4,lacticidven:4) ) Recent Results (from the past 240 hour(s))  SARS Coronavirus 2 by RT PCR (hospital order, performed in [facility] hospital lab) Nasopharyngeal Nasopharyngeal Swab  Status: None   Collection Time: 05/16/20 11:30 AM   Specimen: Nasopharyngeal Swab  Result Value Ref Range Status   SARS Coronavirus 2 NEGATIVE NEGATIVE Final    Comment: (NOTE) SARS-CoV-2 target nucleic acids are NOT DETECTED.  The SARS-CoV-2 RNA is generally detectable in upper and lower respiratory specimens during the acute phase of infection. The lowest concentration of SARS-CoV-2 viral copies this assay can detect is 250 copies / mL. A negative result does not preclude SARS-CoV-2 infection and should not be used as the sole basis for treatment or other patient management decisions.  A negative result may occur with improper specimen collection / handling, submission of specimen other than nasopharyngeal swab, presence of viral mutation(s) within the areas targeted by this assay, and inadequate number of viral copies (<250 copies / mL). A negative result must be combined with clinical observations, patient history, and epidemiological information.  Fact Sheet for Patients:   StrictlyIdeas.no  Fact  Sheet for Healthcare Providers: BankingDealers.co.za  This test is not yet approved or  cleared by the [place] FDA and has been authorized for detection and/or diagnosis of SARS-CoV-2 by FDA under an Emergency Use Authorization (EUA).  This EUA will remain in effect (meaning this test can be used) for the duration of the COVID-19 declaration under Section 564(b)(1) of the Act, 21 U.S.C. section 360bbb-3(b)(1), unless the authorization is terminated or revoked sooner.  Performed at [facility], [address] [place], [place] [address]      Radiological Exams on Admission: CT Head Wo Contrast  Result Date: 05/16/2020 CLINICAL DATA:  Head trauma, headache, fall, dementia EXAM: CT HEAD WITHOUT CONTRAST CT CERVICAL SPINE WITHOUT CONTRAST TECHNIQUE: Multidetector CT imaging of the head and cervical spine was performed following the standard protocol without intravenous contrast. Multiplanar CT image reconstructions of the cervical spine were also generated. COMPARISON:  None. FINDINGS: CT HEAD FINDINGS Brain: No evidence of acute infarction, hemorrhage, hydrocephalus, extra-axial collection or mass lesion/mass effect. Extensive periventricular and deep white matter hypodensity. Mild global cerebral volume loss. Vascular: No hyperdense vessel or unexpected calcification. Skull: Normal. Negative for fracture or focal lesion. Sinuses/Orbits: No acute finding. Other: None. CT CERVICAL SPINE FINDINGS Alignment: Normal. Skull base and vertebrae: No acute fracture. No primary bone lesion or focal pathologic process. Soft tissues and spinal canal: No prevertebral fluid or swelling. No visible canal hematoma. Disc levels:  Intact. Upper chest: Negative. Other: None. IMPRESSION: 1. No acute intracranial pathology. 2. Advanced small-vessel white matter disease and mild global cerebral volume loss. 3. No fracture or static subluxation of the cervical spine. Electronically  Signed   By: Eddie Candle M.D.   On: 05/16/2020 12:58   CT Cervical Spine Wo Contrast  Result Date: 05/16/2020 CLINICAL DATA:  Head trauma, headache, fall, dementia EXAM: CT HEAD WITHOUT CONTRAST CT CERVICAL SPINE WITHOUT CONTRAST TECHNIQUE: Multidetector CT imaging of the head and cervical spine was performed following the standard protocol without intravenous contrast. Multiplanar CT image reconstructions of the cervical spine were also generated. COMPARISON:  None. FINDINGS: CT HEAD FINDINGS Brain: No evidence of acute infarction, hemorrhage, hydrocephalus, extra-axial collection or mass lesion/mass effect. Extensive periventricular and deep white matter hypodensity. Mild global cerebral volume loss. Vascular: No hyperdense vessel or unexpected calcification. Skull: Normal. Negative for fracture or focal lesion. Sinuses/Orbits: No acute finding. Other: None. CT CERVICAL SPINE FINDINGS Alignment: Normal. Skull base and vertebrae: No acute fracture. No primary bone lesion or focal pathologic process. Soft tissues and spinal canal: No prevertebral  fluid or swelling. No visible canal hematoma. Disc levels:  Intact. Upper chest: Negative. Other: None. IMPRESSION: 1. No acute intracranial pathology. 2. Advanced small-vessel white matter disease and mild global cerebral volume loss. 3. No fracture or static subluxation of the cervical spine. Electronically Signed   By: Eddie Candle M.D.   On: 05/16/2020 12:58   DG Chest Portable 1 View  Result Date: 05/16/2020 CLINICAL DATA:  Preop for hip fracture. EXAM: PORTABLE CHEST 1 VIEW COMPARISON:  December 11, 2018. FINDINGS: The heart size and mediastinal contours are within normal limits. Both lungs are clear. The visualized skeletal structures are unremarkable. IMPRESSION: No active disease. Electronically Signed   By: Marijo Conception M.D.   On: 05/16/2020 11:59   DG Hip Unilat W or Wo Pelvis 2-3 Views Right  Result Date: 05/16/2020 CLINICAL DATA:  Right hip pain  after fall. EXAM: DG HIP (WITH OR WITHOUT PELVIS) 2-3V RIGHT COMPARISON:  None. FINDINGS: Moderately displaced fracture is seen involving proximal right femoral neck. No dislocation is noted. Left hip is unremarkable. IMPRESSION: Moderately displaced proximal right femoral neck fracture. Electronically Signed   By: Marijo Conception M.D.   On: 05/16/2020 11:58     EKG: Not done in ED, will get one.   Assessment/Plan Principal Problem:   Closed displaced fracture of right femoral neck (HCC) Active Problems:   Essential hypertension   Dementia without behavioral disturbance (HCC)   Postablative hypothyroidism   Fall at home, initial encounter   Hypokalemia   CKD (chronic kidney disease), stage IIIa   Closed displaced fracture of right femoral neck ([place]):  As evidenced by x-ray. Patient has moderate pain now. No neurovascular compromise. Orthopedic surgeon, Dr. Mack Guise was consulted.   - will admit to Med-surg bed - Pain control: morphine prn and percocet - When necessary Zofran for nausea - Robaxin for muscle spasm - type and cross - INR/PTT  Essential hypertension -Amlodipine -IV hydralazine as needed  Dementia without behavioral disturbance (HCC) -Donepezil and Namenda  Postablative hypothyroidism -Synthroid  Fall at home, initial encounter -PT/OT when able to  Hypokalemia: K= 3.0  on admission. - Repleted - Check Mg level  CKD (chronic kidney disease), stage IIIa: Renal function close to baseline.  Creatinine 1.15, BUN 21 -f/u by BMP   Perioperative Cardiac Risk: He has multiple comorbidities, including hypertension, dementia, hypothyroidism, CKD-3, but no history of congestive heart failure or CAD.  Currently patient does not have any chest pain, shortness of breath.  No leg edema.  No signs of CHF. EKG was not done in ED, will get one. At this time point, no further work up is needed. His GUPTA score perioperative myocardial infarction or cardaic arrest is 1.44 %  which is moderate risk.  I discussed the risk with her daughter who would like patient to proceed for surgery.   DVT ppx: SCD Code Status: Full code Family Communication: Yes, patient's daughter at bed side Disposition Plan:  Anticipate discharge back to previous environment Consults called: Dr. Mack Guise of orthopedic surgery Admission status: Med-surg bed as inpt     Status is: Inpatient  Remains inpatient appropriate because:Inpatient level of care appropriate due to severity of illness   Dispo: The patient is from: Home              Anticipated d/c is to: To be determined              Anticipated d/c date is: 2 days  Patient currently is not medically stable to d/c.           Date of Service 05/16/2020    Ivor Costa [facility]   If 7PM-7AM, please contact night-coverage [email or web address] 05/16/2020, 5:20 PM

## 2020-05-16 NOTE — ED Provider Notes (Signed)
[facility] Emergency Department Provider Note  ____________________________________________   First MD Initiated Contact with Patient 05/16/20 1046     (approximate)  I have reviewed the triage vital signs and the nursing notes.   HISTORY  Chief Complaint No chief complaint on file.    HPI Joanna Aguilar is a 79 y.o. female with dementia who comes in from home for a fall.  Patient had a fall yesterday.  Patient was up in the attic when she was not supposed to be.  Patient has right hip pain.  Family found patient was last night and was able to put her to bed but then has been unable to ambulate secondary to pain this morning.  Patient has pain in the right hip that is severe, constant, worse with moving the leg, better at rest.  Unclear if she hit her head or not.  She denies any chest pain, abdominal pain.          Past Medical History:  Diagnosis Date  . Dementia ([place])   . Hypertension   . Thyroid disease     Patient Active Problem List   Diagnosis Date Noted  . Encounter for general adult medical examination with abnormal findings 03/12/2020  . Aortic arch atherosclerosis ([place]) 03/12/2020  . Vitamin D deficiency 03/12/2020  . Dysuria 03/12/2020  . Generalized abdominal pain 02/10/2020  . 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 Acute upper respiratory infection 05/12/2018  . Cough 05/12/2018  . Thyroid disease 02/23/2018  . Essential hypertension 02/23/2018  . Dementia without behavioral disturbance ([place]) 02/23/2018  . B12 deficiency 12/06/2017    Past Surgical History:  Procedure Laterality Date  . ABDOMINAL HYSTERECTOMY      Prior to Admission medications   Medication Sig Start Date End Date Taking? Authorizing Provider  amLODipine (NORVASC) 5 MG tablet TAKE 1 TABLET BY MOUTH EVERY MORNING FOR HTN 04/23/20   Boscia, Heather E, NP  donepezil  (ARICEPT) 10 MG tablet TAKE ONE TABLET BY MOUTH ONCE DAILY IN THE MORNING FOR MEMORY 02/15/20   Ronnell Freshwater, NP  ergocalciferol (DRISDOL) 1.25 MG (50000 UT) capsule Take 1 capsule (50,000 Units total) by mouth once a week. 03/12/20   Boscia, Greer Ee, NP  memantine (NAMENDA) 5 MG tablet TAKE 1 TABLET BY MOUTH EVERY DAY FOR MEMORY 02/15/20   Ronnell Freshwater, NP  ondansetron (ZOFRAN ODT) 4 MG disintegrating tablet Take 1 tablet (4 mg total) by mouth every 8 (eight) hours as needed. 12/11/18   Rudene Re, MD  SYNTHROID 88 MCG tablet Take 88 mcg by mouth daily. 12/31/18   [provider]  triamcinolone cream (KENALOG) 0.1 % Apply 1 application topically 2 (two) times daily.    [provider]    Allergies Penicillins  Family History  Problem Relation Age of Onset  . Diabetes Neg Hx   . Arthritis Neg Hx     Social History Social History   Tobacco Use  . Smoking status: Never Smoker  . Smokeless tobacco: Never Used  Vaping Use  . Vaping Use: Never used  Substance Use Topics  . Alcohol use: No  . Drug use: Never      Review of Systems Constitutional: No fever/chills Eyes: No visual changes. ENT: No sore throat. Cardiovascular: Denies chest pain. Respiratory: Denies shortness of breath. Gastrointestinal: No abdominal pain.  No nausea, no vomiting.  No diarrhea.  No  constipation. Genitourinary: Negative for dysuria. Musculoskeletal: Negative for back pain.  Right hip pain Skin: Negative for rash. Neurological: Negative for headaches, focal weakness or numbness. All other ROS negative ____________________________________________   PHYSICAL EXAM:  VITAL SIGNS: Blood pressure (!) 154/91, pulse 71, temperature 98.1 F (36.7 C), temperature source Oral, resp. rate 16, height 5\' 2"  (1.575 m), weight 36.3 kg, SpO2 96 %.   Constitutional: Alert with some baseline dementia.  Well appearing and in no acute distress. Eyes: Conjunctivae are normal.  EOMI. Head: Atraumatic. Nose: No congestion/rhinnorhea. Mouth/Throat: Mucous membranes are moist.   Neck: No stridor. Trachea Midline. FROM Cardiovascular: Normal rate, regular rhythm. Grossly normal heart sounds.  Good peripheral circulation. No chest wall tenderness  Respiratory: Normal respiratory effort.  No retractions. Lungs CTAB. Gastrointestinal: Soft and nontender. No distention. No abdominal bruits.  Musculoskeletal: Tenderness over the right hip with limited range of motion of the leg shortened and rotated.  Good distal pulse sensation intact. Able to range left hip but referred pain in the right hip;. Neurologic:  Normal speech and language. No gross focal neurologic deficits are appreciated.  Skin:  Skin is warm, dry and intact. No rash noted. Psychiatric: Mood and affect are normal. Speech and behavior are normal. GU: Deferred   ____________________________________________   LABS (all labs ordered are listed, but only abnormal results are displayed)  Labs Reviewed  CBC WITH DIFFERENTIAL/PLATELET - Abnormal; Notable for the following components:      Result Value   Hemoglobin 10.7 (*)    HCT 31.9 (*)    All other components within normal limits  COMPREHENSIVE METABOLIC PANEL - Abnormal; Notable for the following components:   Potassium 3.0 (*)    Glucose, Bld 144 (*)    Creatinine, Ser 1.15 (*)    GFR calc non Af Amer 46 (*)    GFR calc Af Amer 54 (*)    All other components within normal limits  SARS CORONAVIRUS 2 BY RT PCR (HOSPITAL ORDER, [facility])  PROTIME-INR  APTT   ____________________________________________   ED ECG REPORT I, Vanessa [place], the attending physician, personally viewed and interpreted this ECG.  Normal sinus rate of 75, no ST elevation, no T wave inversions, normal intervals ____________________________________________  RADIOLOGY Robert Bellow, personally viewed and evaluated these images (plain  radiographs) as part of my medical decision making, as well as reviewing the written report by the radiologist.  ED MD interpretation: Femoral neck fracture on the right  Official radiology report(s): CT Head Wo Contrast  Result Date: 05/16/2020 CLINICAL DATA:  Head trauma, headache, fall, dementia EXAM: CT HEAD WITHOUT CONTRAST CT CERVICAL SPINE WITHOUT CONTRAST TECHNIQUE: Multidetector CT imaging of the head and cervical spine was performed following the standard protocol without intravenous contrast. Multiplanar CT image reconstructions of the cervical spine were also generated. COMPARISON:  None. FINDINGS: CT HEAD FINDINGS Brain: No evidence of acute infarction, hemorrhage, hydrocephalus, extra-axial collection or mass lesion/mass effect. Extensive periventricular and deep white matter hypodensity. Mild global cerebral volume loss. Vascular: No hyperdense vessel or unexpected calcification. Skull: Normal. Negative for fracture or focal lesion. Sinuses/Orbits: No acute finding. Other: None. CT CERVICAL SPINE FINDINGS Alignment: Normal. Skull base and vertebrae: No acute fracture. No primary bone lesion or focal pathologic process. Soft tissues and spinal canal: No prevertebral fluid or swelling. No visible canal hematoma. Disc levels:  Intact. Upper chest: Negative. Other: None. IMPRESSION: 1. No acute intracranial pathology. 2. Advanced small-vessel white matter disease  and mild global cerebral volume loss. 3. No fracture or static subluxation of the cervical spine. Electronically Signed   By: Eddie Candle M.D.   On: 05/16/2020 12:58   CT Cervical Spine Wo Contrast  Result Date: 05/16/2020 CLINICAL DATA:  Head trauma, headache, fall, dementia EXAM: CT HEAD WITHOUT CONTRAST CT CERVICAL SPINE WITHOUT CONTRAST TECHNIQUE: Multidetector CT imaging of the head and cervical spine was performed following the standard protocol without intravenous contrast. Multiplanar CT image reconstructions of the cervical  spine were also generated. COMPARISON:  None. FINDINGS: CT HEAD FINDINGS Brain: No evidence of acute infarction, hemorrhage, hydrocephalus, extra-axial collection or mass lesion/mass effect. Extensive periventricular and deep white matter hypodensity. Mild global cerebral volume loss. Vascular: No hyperdense vessel or unexpected calcification. Skull: Normal. Negative for fracture or focal lesion. Sinuses/Orbits: No acute finding. Other: None. CT CERVICAL SPINE FINDINGS Alignment: Normal. Skull base and vertebrae: No acute fracture. No primary bone lesion or focal pathologic process. Soft tissues and spinal canal: No prevertebral fluid or swelling. No visible canal hematoma. Disc levels:  Intact. Upper chest: Negative. Other: None. IMPRESSION: 1. No acute intracranial pathology. 2. Advanced small-vessel white matter disease and mild global cerebral volume loss. 3. No fracture or static subluxation of the cervical spine. Electronically Signed   By: Eddie Candle M.D.   On: 05/16/2020 12:58   DG Chest Portable 1 View  Result Date: 05/16/2020 CLINICAL DATA:  Preop for hip fracture. EXAM: PORTABLE CHEST 1 VIEW COMPARISON:  December 11, 2018. FINDINGS: The heart size and mediastinal contours are within normal limits. Both lungs are clear. The visualized skeletal structures are unremarkable. IMPRESSION: No active disease. Electronically Signed   By: Marijo Conception M.D.   On: 05/16/2020 11:59   DG Hip Unilat W or Wo Pelvis 2-3 Views Right  Result Date: 05/16/2020 CLINICAL DATA:  Right hip pain after fall. EXAM: DG HIP (WITH OR WITHOUT PELVIS) 2-3V RIGHT COMPARISON:  None. FINDINGS: Moderately displaced fracture is seen involving proximal right femoral neck. No dislocation is noted. Left hip is unremarkable. IMPRESSION: Moderately displaced proximal right femoral neck fracture. Electronically Signed   By: Marijo Conception M.D.   On: 05/16/2020 11:58     ____________________________________________   PROCEDURES  Procedure(s) performed (including Critical Care):  Procedures   ____________________________________________   INITIAL IMPRESSION / ASSESSMENT AND PLAN / ED COURSE  Joanna Aguilar was evaluated in Emergency Department on 05/16/2020 for the symptoms described in the history of present illness. She was evaluated in the context of the global COVID-19 pandemic, which necessitated consideration that the patient might be at risk for infection with the SARS-CoV-2 virus that causes COVID-19. Institutional protocols and algorithms that pertain to the evaluation of patients at risk for COVID-19 are in a state of rapid change based on information released by regulatory bodies including the CDC and federal and state organizations. These policies and algorithms were followed during the patient's care in the ED.    Patient is a 79 year old who comes in with mechanical fall with right hip pain.  X-ray to evaluate for hip fracture.  Will get CT head to evaluate her intracranial hemorrhage and CT cervical evaluate for cervical fracture.  Does not have any chest wall tenderness or abdominal tenderness to suggest other injuries of ribs or intra-abdominal injuries.  No other extremity pain.  Will get some preop labs and EKG.  We will give some IV Dilaudid to help facilitate x-rays and CT scan   Labs reassuring.  Potassium slightly low we will give some oral repletion.  X-ray confirms femoral neck fracture.  CT head and neck are negative for acute processes.  Discussed with Dr. Mack Guise from orthopedics and he is aware of patient.  We will discussed the hospital team for admission;.     ____________________________________________   FINAL CLINICAL IMPRESSION(S) / ED DIAGNOSES   Final diagnoses:  Fall, initial encounter  Closed fracture of neck of right femur, initial encounter ([place])      MEDICATIONS GIVEN DURING THIS  VISIT:  Medications  potassium chloride SA (KLOR-CON) CR tablet 40 mEq (has no administration in time range)  HYDROmorphone (DILAUDID) injection 0.5 mg (0.5 mg Intravenous Given 05/16/20 1145)  ondansetron (ZOFRAN) injection 4 mg (4 mg Intravenous Given 05/16/20 1145)     ED Discharge Orders    None       Note:  This document was prepared using Dragon voice recognition software and may include unintentional dictation errors.   Vanessa [place], MD 05/16/20 1328

## 2020-05-16 NOTE — Consult Note (Signed)
ORTHOPAEDIC CONSULTATION  REQUESTING PHYSICIAN: Ivor Costa, MD  Chief Complaint: Right hip pain status post fall  HPI: Joanna Aguilar is a 79 y.o. female with dementia, hypothyroidism and chronic kidney disease who was seen in ER this evening with her daughter at the bedside.  Patient was brought to the [facility] emergency department after a fall at home yesterday coming down from the attic.  Her family found her overnight and put her to bed but the patient had continued right hip pain and was unable to ambulate today and was brought to the [facility] emergency department.  Patient complains of pain in the right hip.  Past Medical History:  Diagnosis Date  . Dementia ([place])   . Hypertension   . Thyroid disease    Past Surgical History:  Procedure Laterality Date  . ABDOMINAL HYSTERECTOMY     Social History   Socioeconomic History  . Marital status: Widowed    Spouse name: Not on file  . Number of children: Not on file  . Years of education: Not on file  . Highest education level: Not on file  Occupational History  . Not on file  Tobacco Use  . Smoking status: Never Smoker  . Smokeless tobacco: Never Used  Vaping Use  . Vaping Use: Never used  Substance and Sexual Activity  . Alcohol use: No  . Drug use: Never  . Sexual activity: Not on file  Other Topics Concern  . Not on file  Social History Narrative  . Not on file   Social Determinants of Health   Financial Resource Strain:   . Difficulty of Paying Living Expenses:   Food Insecurity:   . Worried About Charity fundraiser in the Last Year:   . Arboriculturist in the Last Year:   Transportation Needs:   . Film/video editor (Medical):   Marland Kitchen Lack of Transportation (Non-Medical):   Physical Activity:   . Days of Exercise per Week:   . Minutes of Exercise per Session:   Stress:   . Feeling of Stress :   Social Connections:   . Frequency of Communication with Friends and Family:   .  Frequency of Social Gatherings with Friends and Family:   . Attends Religious Services:   . Active Member of Clubs or Organizations:   . Attends Archivist Meetings:   Marland Kitchen Marital Status:    Family History  Problem Relation Age of Onset  . Diabetes Neg Hx   . Arthritis Neg Hx    Allergies  Allergen Reactions  . Penicillins Rash   Prior to Admission medications   Medication Sig Start Date End Date Taking? Authorizing Provider  amLODipine (NORVASC) 5 MG tablet TAKE 1 TABLET BY MOUTH EVERY MORNING FOR HTN Patient taking differently: Take 5 mg by mouth daily.  04/23/20  Yes Boscia, Heather E, NP  donepezil (ARICEPT) 10 MG tablet TAKE ONE TABLET BY MOUTH ONCE DAILY IN THE MORNING FOR MEMORY Patient taking differently: Take 10 mg by mouth daily.  02/15/20  Yes Boscia, Greer Ee, NP  ergocalciferol (DRISDOL) 1.25 MG (50000 UT) capsule Take 1 capsule (50,000 Units total) by mouth once a week. 03/12/20  Yes Boscia, Heather E, NP  memantine (NAMENDA) 5 MG tablet TAKE 1 TABLET BY MOUTH EVERY DAY FOR MEMORY Patient taking differently: Take 5 mg by mouth daily.  02/15/20  Yes Boscia, Heather E, NP  SYNTHROID 88 MCG tablet Take 88 mcg by mouth daily. 12/31/18  Yes [provider]  triamcinolone cream (KENALOG) 0.1 % Apply 1 application topically 2 (two) times daily.   Yes [provider]   CT Head Wo Contrast  Result Date: 05/16/2020 CLINICAL DATA:  Head trauma, headache, fall, dementia EXAM: CT HEAD WITHOUT CONTRAST CT CERVICAL SPINE WITHOUT CONTRAST TECHNIQUE: Multidetector CT imaging of the head and cervical spine was performed following the standard protocol without intravenous contrast. Multiplanar CT image reconstructions of the cervical spine were also generated. COMPARISON:  None. FINDINGS: CT HEAD FINDINGS Brain: No evidence of acute infarction, hemorrhage, hydrocephalus, extra-axial collection or mass lesion/mass effect. Extensive periventricular and deep white matter  hypodensity. Mild global cerebral volume loss. Vascular: No hyperdense vessel or unexpected calcification. Skull: Normal. Negative for fracture or focal lesion. Sinuses/Orbits: No acute finding. Other: None. CT CERVICAL SPINE FINDINGS Alignment: Normal. Skull base and vertebrae: No acute fracture. No primary bone lesion or focal pathologic process. Soft tissues and spinal canal: No prevertebral fluid or swelling. No visible canal hematoma. Disc levels:  Intact. Upper chest: Negative. Other: None. IMPRESSION: 1. No acute intracranial pathology. 2. Advanced small-vessel white matter disease and mild global cerebral volume loss. 3. No fracture or static subluxation of the cervical spine. Electronically Signed   By: Eddie Candle M.D.   On: 05/16/2020 12:58   CT Cervical Spine Wo Contrast  Result Date: 05/16/2020 CLINICAL DATA:  Head trauma, headache, fall, dementia EXAM: CT HEAD WITHOUT CONTRAST CT CERVICAL SPINE WITHOUT CONTRAST TECHNIQUE: Multidetector CT imaging of the head and cervical spine was performed following the standard protocol without intravenous contrast. Multiplanar CT image reconstructions of the cervical spine were also generated. COMPARISON:  None. FINDINGS: CT HEAD FINDINGS Brain: No evidence of acute infarction, hemorrhage, hydrocephalus, extra-axial collection or mass lesion/mass effect. Extensive periventricular and deep white matter hypodensity. Mild global cerebral volume loss. Vascular: No hyperdense vessel or unexpected calcification. Skull: Normal. Negative for fracture or focal lesion. Sinuses/Orbits: No acute finding. Other: None. CT CERVICAL SPINE FINDINGS Alignment: Normal. Skull base and vertebrae: No acute fracture. No primary bone lesion or focal pathologic process. Soft tissues and spinal canal: No prevertebral fluid or swelling. No visible canal hematoma. Disc levels:  Intact. Upper chest: Negative. Other: None. IMPRESSION: 1. No acute intracranial pathology. 2. Advanced  small-vessel white matter disease and mild global cerebral volume loss. 3. No fracture or static subluxation of the cervical spine. Electronically Signed   By: Eddie Candle M.D.   On: 05/16/2020 12:58   DG Chest Portable 1 View  Result Date: 05/16/2020 CLINICAL DATA:  Preop for hip fracture. EXAM: PORTABLE CHEST 1 VIEW COMPARISON:  December 11, 2018. FINDINGS: The heart size and mediastinal contours are within normal limits. Both lungs are clear. The visualized skeletal structures are unremarkable. IMPRESSION: No active disease. Electronically Signed   By: Marijo Conception M.D.   On: 05/16/2020 11:59   DG Hip Unilat W or Wo Pelvis 2-3 Views Right  Result Date: 05/16/2020 CLINICAL DATA:  Right hip pain after fall. EXAM: DG HIP (WITH OR WITHOUT PELVIS) 2-3V RIGHT COMPARISON:  None. FINDINGS: Moderately displaced fracture is seen involving proximal right femoral neck. No dislocation is noted. Left hip is unremarkable. IMPRESSION: Moderately displaced proximal right femoral neck fracture. Electronically Signed   By: Marijo Conception M.D.   On: 05/16/2020 11:58    Positive ROS: All other systems have been reviewed and were otherwise negative with the exception of those mentioned in the HPI and as above.  Physical Exam: General:  Drowsy but arousable, no acute distress  MUSCULOSKELETAL: Right lower extremity: Patient has shortening and external rotation to the right lower extremity.  The skin overlying the right hip is intact.  There is no significant swelling of the right thigh or lower leg.  Compartments are soft and compressible.  She has palpable pedal pulses, intact/light touch intact motor function distally.  Assessment: Displaced right femoral neck hip fracture  Plan: I reviewed the patient's x-rays.  She has a displaced right femoral neck hip fracture.  I recommended right hip hemiarthroplasty as treatment for this fracture.  I discussed the details of the operation as well as the postoperative  course with the patient's daughter.   I also discussed the risks and benefits of surgery. The patient's daughter understands the risks include but are not limited to infection, bleeding requiring blood transfusion, nerve or blood vessel injury, joint stiffness or loss of motion, persistent pain, weakness or instability, fracture, dislocation, leg length discrepancy and change in lower extremity rotation, hardware failure and the need for further surgery. Medical risks include but are not limited to DVT and pulmonary embolism, myocardial infarction, stroke, pneumonia, respiratory failure and death. Patient's daughter understood these risks and agreed with the surgical plan.   Surgery is scheduled for 1:00 PM tomorrow.  Patient will be n.p.o. after midnight.  She should not receive any anticoagulation medication overnight.  I reviewed the patient's laboratory studies and radiographs in preparation for this case.  Patient has been seen by the hospitalist service.  No further cardiac work-up is required.  Patient is deemed at moderate risk for surgery.   Thornton [place], MD    05/16/2020 6:28 PM

## 2020-05-16 NOTE — ED Triage Notes (Signed)
Daughter at bedside who st pt fell at home yesterday. St pt attempted to climb the stairs to the attic and lost her balance and feel on right hip. Shortening on right leg noted.

## 2020-05-16 NOTE — ED Triage Notes (Addendum)
Pt from home via [facility]. Per Brion Aliment pt is a poor historian who presents to ED with a hx of dementia. Pt comes from home, fell  yesterday injuring her right hip. Shortening of r/leg noted by EDP Funke.  Family on the way.

## 2020-05-16 NOTE — ED Notes (Signed)
Pt to xray

## 2020-05-16 NOTE — ED Notes (Addendum)
Attempted to call report at this time 

## 2020-05-16 NOTE — ED Notes (Signed)
This RN taking over pt's care at this time. Pt taken to x-ray untriage at this time.  EDP Funke provided report to this Rn regarding pt's CC.

## 2020-05-17 ENCOUNTER — Encounter
Admission: EM | Disposition: A | Discharge status: Skilled Nursing Facility | Payer: Self-pay | Source: Home / Self Care | Attending: Internal Medicine

## 2020-05-17 ENCOUNTER — Inpatient Hospital Stay: Payer: Medicare Other | Admitting: Anesthesiology

## 2020-05-17 ENCOUNTER — Ambulatory Visit: Payer: Medicare Other | Admitting: Nurse Practitioner

## 2020-05-17 ENCOUNTER — Inpatient Hospital Stay: Payer: Medicare Other

## 2020-05-17 HISTORY — PX: TOTAL HIP ARTHROPLASTY: SHX124

## 2020-05-17 LAB — CBC
HCT: 34.4 % — ABNORMAL LOW (ref 36.0–46.0)
Hemoglobin: 11.8 g/dL — ABNORMAL LOW (ref 12.0–15.0)
MCH: 26.9 pg (ref 26.0–34.0)
MCHC: 34.3 g/dL (ref 30.0–36.0)
MCV: 78.5 fL — ABNORMAL LOW (ref 80.0–100.0)
Platelets: 169 10*3/uL (ref 150–400)
RBC: 4.38 MIL/uL (ref 3.87–5.11)
RDW: 14.4 % (ref 11.5–15.5)
WBC: 7.6 10*3/uL (ref 4.0–10.5)
nRBC: 0 % (ref 0.0–0.2)

## 2020-05-17 LAB — BASIC METABOLIC PANEL
Anion gap: 12 (ref 5–15)
BUN: 21 mg/dL (ref 8–23)
CO2: 27 mmol/L (ref 22–32)
Calcium: 9.5 mg/dL (ref 8.9–10.3)
Chloride: 98 mmol/L (ref 98–111)
Creatinine, Ser: 1.1 mg/dL — ABNORMAL HIGH (ref 0.44–1.00)
GFR calc Af Amer: 56 mL/min — ABNORMAL LOW (ref >60)
GFR calc non Af Amer: 49 mL/min — ABNORMAL LOW (ref >60)
Glucose, Bld: 140 mg/dL — ABNORMAL HIGH (ref 70–99)
Potassium: 3.8 mmol/L (ref 3.5–5.1)
Sodium: 137 mmol/L (ref 135–145)

## 2020-05-17 LAB — SURGICAL PCR SCREEN
MRSA, PCR: NEGATIVE
Staphylococcus aureus: POSITIVE — AB

## 2020-05-17 LAB — MAGNESIUM: Magnesium: 1.9 mg/dL (ref 1.7–2.4)

## 2020-05-17 LAB — ABO/RH: ABO/RH(D): B POS

## 2020-05-17 SURGERY — ARTHROPLASTY, HIP, TOTAL,POSTERIOR APPROACH
Anesthesia: Spinal | Site: Hip | Laterality: Right

## 2020-05-17 MED ORDER — ADULT MULTIVITAMIN W/MINERALS CH
1.0000 | ORAL_TABLET | Freq: Every day | ORAL | Status: DC
  Administered 2020-05-17 – 2020-05-22 (×5): 1 via ORAL
  Filled 2020-05-17 (×6): qty 1

## 2020-05-17 MED ORDER — LACTATED RINGERS IV SOLN
INTRAVENOUS | Status: DC | PRN
Start: 2020-05-17 — End: 2020-05-17

## 2020-05-17 MED ORDER — PROPOFOL 500 MG/50ML IV EMUL
INTRAVENOUS | Status: AC
  Filled 2020-05-17: qty 50

## 2020-05-17 MED ORDER — FENTANYL CITRATE (PF) 100 MCG/2ML IJ SOLN
INTRAMUSCULAR | Status: AC
  Filled 2020-05-17: qty 2

## 2020-05-17 MED ORDER — ENSURE ENLIVE PO LIQD
237.0000 mL | ORAL | Status: DC
  Administered 2020-05-17 – 2020-05-22 (×5): 237 mL via ORAL

## 2020-05-17 MED ORDER — ACETAMINOPHEN 10 MG/ML IV SOLN
INTRAVENOUS | Status: DC | PRN
  Administered 2020-05-17: 750 mg via INTRAVENOUS

## 2020-05-17 MED ORDER — FENTANYL CITRATE (PF) 100 MCG/2ML IJ SOLN
INTRAMUSCULAR | Status: DC | PRN
  Administered 2020-05-17 (×4): 25 ug via INTRAVENOUS

## 2020-05-17 MED ORDER — KETAMINE HCL 50 MG/ML IJ SOLN
INTRAMUSCULAR | Status: DC | PRN
  Administered 2020-05-17: 20 mg via INTRAMUSCULAR

## 2020-05-17 MED ORDER — BISACODYL 10 MG RE SUPP
10.0000 mg | Freq: Every day | RECTAL | Status: DC | PRN
  Administered 2020-05-19 – 2020-05-21 (×2): 10 mg via RECTAL
  Filled 2020-05-17 (×2): qty 1

## 2020-05-17 MED ORDER — HYDROCODONE-ACETAMINOPHEN 5-325 MG PO TABS
1.0000 | ORAL_TABLET | ORAL | Status: DC | PRN
  Administered 2020-05-18 – 2020-05-21 (×3): 1 via ORAL
  Administered 2020-05-21: 2 via ORAL
  Filled 2020-05-17 (×3): qty 1
  Filled 2020-05-17: qty 2
  Filled 2020-05-17: qty 1

## 2020-05-17 MED ORDER — ENOXAPARIN SODIUM 30 MG/0.3ML ~~LOC~~ SOLN
30.0000 mg | SUBCUTANEOUS | Status: DC
  Administered 2020-05-18 – 2020-05-22 (×5): 30 mg via SUBCUTANEOUS
  Filled 2020-05-17 (×5): qty 0.3

## 2020-05-17 MED ORDER — ALUM & MAG HYDROXIDE-SIMETH 200-200-20 MG/5ML PO SUSP: 30.0000 mL | ORAL | Status: DC | PRN

## 2020-05-17 MED ORDER — KETAMINE HCL 50 MG/ML IJ SOLN
INTRAMUSCULAR | Status: AC
  Filled 2020-05-17: qty 10

## 2020-05-17 MED ORDER — JUVEN PO PACK
1.0000 | PACK | Freq: Two times a day (BID) | ORAL | Status: DC
  Administered 2020-05-18 – 2020-05-22 (×7): 1 via ORAL

## 2020-05-17 MED ORDER — ACETAMINOPHEN 10 MG/ML IV SOLN
INTRAVENOUS | Status: AC
  Filled 2020-05-17: qty 100

## 2020-05-17 MED ORDER — CHLORHEXIDINE GLUCONATE CLOTH 2 % EX PADS
6.0000 | MEDICATED_PAD | Freq: Every day | CUTANEOUS | Status: DC
  Administered 2020-05-17 – 2020-05-22 (×4): 6 via TOPICAL

## 2020-05-17 MED ORDER — BUPIVACAINE HCL (PF) 0.5 % IJ SOLN
INTRAMUSCULAR | Status: AC
  Filled 2020-05-17: qty 10

## 2020-05-17 MED ORDER — ACETAMINOPHEN 500 MG PO TABS
500.0000 mg | ORAL_TABLET | Freq: Four times a day (QID) | ORAL | Status: AC
  Administered 2020-05-17 – 2020-05-18 (×4): 500 mg via ORAL
  Filled 2020-05-17 (×3): qty 1

## 2020-05-17 MED ORDER — POLYETHYLENE GLYCOL 3350 17 G PO PACK: 17.0000 g | PACK | Freq: Every day | ORAL | Status: DC | PRN

## 2020-05-17 MED ORDER — FENTANYL CITRATE (PF) 100 MCG/2ML IJ SOLN: 25.0000 ug | INTRAMUSCULAR | Status: DC | PRN

## 2020-05-17 MED ORDER — HYDROCODONE-ACETAMINOPHEN 7.5-325 MG PO TABS: 1.0000 | ORAL_TABLET | ORAL | Status: DC | PRN

## 2020-05-17 MED ORDER — ONDANSETRON HCL 4 MG PO TABS: 4.0000 mg | ORAL_TABLET | Freq: Four times a day (QID) | ORAL | Status: DC | PRN

## 2020-05-17 MED ORDER — SODIUM CHLORIDE FLUSH 0.9 % IV SOLN
INTRAVENOUS | Status: AC
  Filled 2020-05-17: qty 10

## 2020-05-17 MED ORDER — ONDANSETRON HCL 4 MG/2ML IJ SOLN
INTRAMUSCULAR | Status: AC
  Filled 2020-05-17: qty 2

## 2020-05-17 MED ORDER — CEFAZOLIN SODIUM-DEXTROSE 1-4 GM/50ML-% IV SOLN
1.0000 g | Freq: Four times a day (QID) | INTRAVENOUS | Status: AC
  Administered 2020-05-17 – 2020-05-18 (×2): 1 g via INTRAVENOUS
  Filled 2020-05-17 (×2): qty 50

## 2020-05-17 MED ORDER — NEOMYCIN-POLYMYXIN B GU 40-200000 IR SOLN
Status: DC | PRN
  Administered 2020-05-17: 16 mL

## 2020-05-17 MED ORDER — BUPIVACAINE HCL (PF) 0.5 % IJ SOLN
INTRAMUSCULAR | Status: DC | PRN
  Administered 2020-05-17: 2 mL via INTRATHECAL

## 2020-05-17 MED ORDER — SODIUM CHLORIDE 0.9 % IV SOLN
INTRAVENOUS | Status: DC | PRN
  Administered 2020-05-17: 25 ug/min via INTRAVENOUS

## 2020-05-17 MED ORDER — MORPHINE SULFATE (PF) 2 MG/ML IV SOLN: 0.5000 mg | INTRAVENOUS | Status: DC | PRN

## 2020-05-17 MED ORDER — PROPOFOL 500 MG/50ML IV EMUL
INTRAVENOUS | Status: DC | PRN
  Administered 2020-05-17: 50 ug/kg/min via INTRAVENOUS

## 2020-05-17 MED ORDER — OXYCODONE HCL 5 MG PO TABS: 5.0000 mg | ORAL_TABLET | Freq: Once | ORAL | Status: DC | PRN

## 2020-05-17 MED ORDER — DOCUSATE SODIUM 100 MG PO CAPS
100.0000 mg | ORAL_CAPSULE | Freq: Two times a day (BID) | ORAL | Status: DC
  Administered 2020-05-17 – 2020-05-22 (×9): 100 mg via ORAL
  Filled 2020-05-17 (×10): qty 1

## 2020-05-17 MED ORDER — SENNA 8.6 MG PO TABS
1.0000 | ORAL_TABLET | Freq: Two times a day (BID) | ORAL | Status: DC
  Administered 2020-05-17 – 2020-05-22 (×9): 8.6 mg via ORAL
  Filled 2020-05-17 (×10): qty 1

## 2020-05-17 MED ORDER — PHENYLEPHRINE HCL (PRESSORS) 10 MG/ML IV SOLN
INTRAVENOUS | Status: AC
  Filled 2020-05-17: qty 1

## 2020-05-17 MED ORDER — MAGNESIUM CITRATE PO SOLN
1.0000 | Freq: Once | ORAL | Status: DC | PRN
  Filled 2020-05-17: qty 296

## 2020-05-17 MED ORDER — OXYCODONE HCL 5 MG/5ML PO SOLN: 5.0000 mg | Freq: Once | ORAL | Status: DC | PRN

## 2020-05-17 MED ORDER — TRAMADOL HCL 50 MG PO TABS
50.0000 mg | ORAL_TABLET | Freq: Four times a day (QID) | ORAL | Status: DC
  Administered 2020-05-17 – 2020-05-22 (×18): 50 mg via ORAL
  Filled 2020-05-17 (×19): qty 1

## 2020-05-17 MED ORDER — PHENYLEPHRINE HCL (PRESSORS) 10 MG/ML IV SOLN
INTRAVENOUS | Status: DC | PRN
  Administered 2020-05-17 (×2): 100 ug via INTRAVENOUS

## 2020-05-17 MED ORDER — CEFAZOLIN SODIUM-DEXTROSE 2-3 GM-%(50ML) IV SOLR
INTRAVENOUS | Status: DC | PRN
Start: 2020-05-17 — End: 2020-05-17
  Administered 2020-05-17: 2 g via INTRAVENOUS

## 2020-05-17 MED ORDER — ONDANSETRON HCL 4 MG/2ML IJ SOLN: 4.0000 mg | Freq: Four times a day (QID) | INTRAMUSCULAR | Status: DC | PRN

## 2020-05-17 SURGICAL SUPPLY — 56 items
BLADE SAGITTAL WIDE XTHICK NO (BLADE) ×2 IMPLANT
BLADE SURG SZ10 CARB STEEL (BLADE) ×2 IMPLANT
BNDG COHESIVE 4X5 TAN STRL (GAUZE/BANDAGES/DRESSINGS) ×2 IMPLANT
CANISTER SUCT 1200ML W/VALVE (MISCELLANEOUS) ×2 IMPLANT
CANISTER SUCT 3000ML PPV (MISCELLANEOUS) ×4 IMPLANT
COVER BACK TABLE REUSABLE LG (DRAPES) ×2 IMPLANT
COVER WAND RF STERILE (DRAPES) ×2 IMPLANT
DRAPE 3/4 80X56 (DRAPES) ×4 IMPLANT
DRAPE INCISE IOBAN 66X60 STRL (DRAPES) ×2 IMPLANT
DRAPE SPLIT 6X30 W/TAPE (DRAPES) ×4 IMPLANT
DRAPE SURG 17X11 SM STRL (DRAPES) ×2 IMPLANT
DRSG AQUACEL AG ADV 3.5X10 (GAUZE/BANDAGES/DRESSINGS) ×2 IMPLANT
DRSG OPSITE POSTOP 4X10 (GAUZE/BANDAGES/DRESSINGS) IMPLANT
DURAPREP 26ML APPLICATOR (WOUND CARE) ×6 IMPLANT
ELECT BLADE 6.5 EXT (BLADE) ×2 IMPLANT
ELECT CAUTERY BLADE 6.4 (BLADE) ×2 IMPLANT
ELECT REM PT RETURN 9FT ADLT (ELECTROSURGICAL) ×2
ELECTRODE REM PT RTRN 9FT ADLT (ELECTROSURGICAL) ×1 IMPLANT
GAUZE SPONGE 4X4 12PLY STRL (GAUZE/BANDAGES/DRESSINGS) IMPLANT
GAUZE XEROFORM 1X8 LF (GAUZE/BANDAGES/DRESSINGS) IMPLANT
GLOVE BIOGEL PI IND STRL 9 (GLOVE) ×1 IMPLANT
GLOVE BIOGEL PI INDICATOR 9 (GLOVE) ×1
GLOVE SURG 9.0 ORTHO LTXF (GLOVE) ×4 IMPLANT
GOWN STRL REUS TWL 2XL XL LVL4 (GOWN DISPOSABLE) ×2 IMPLANT
GOWN STRL REUS W/ TWL LRG LVL3 (GOWN DISPOSABLE) ×1 IMPLANT
GOWN STRL REUS W/TWL LRG LVL3 (GOWN DISPOSABLE) ×1
HEAD MODULAR ENDO (Orthopedic Implant) ×1 IMPLANT
HEAD UNPLR 46XMDLR STRL HIP (Orthopedic Implant) ×1 IMPLANT
HEMOVAC 400ML (MISCELLANEOUS)
KIT DRAIN HEMOVAC JP 7FR 400ML (MISCELLANEOUS) IMPLANT
KIT TURNOVER KIT A (KITS) ×2 IMPLANT
NDL SAFETY ECLIPSE 18X1.5 (NEEDLE) ×1 IMPLANT
NEEDLE FILTER BLUNT 18X 1/2SAF (NEEDLE) ×1
NEEDLE FILTER BLUNT 18X1 1/2 (NEEDLE) ×1 IMPLANT
NEEDLE HYPO 18GX1.5 SHARP (NEEDLE) ×1
NEEDLE MAYO CATGUT SZ4 (NEEDLE) ×2 IMPLANT
NS IRRIG 1000ML POUR BTL (IV SOLUTION) ×2 IMPLANT
PACK HIP PROSTHESIS (MISCELLANEOUS) ×2 IMPLANT
PILLOW ABDUCTION FOAM SM (MISCELLANEOUS) ×2 IMPLANT
PULSAVAC PLUS IRRIG FAN TIP (DISPOSABLE) ×2
RETRIEVER SUT HEWSON (MISCELLANEOUS) ×2 IMPLANT
SLEEVE UNITRAX V40 (Orthopedic Implant) ×1 IMPLANT
SLEEVE UNITRAX V40 +4 (Orthopedic Implant) ×1 IMPLANT
SOL .9 NS 3000ML IRR  AL (IV SOLUTION) ×1
SOL .9 NS 3000ML IRR UROMATIC (IV SOLUTION) ×1 IMPLANT
STAPLER SKIN PROX 35W (STAPLE) ×2 IMPLANT
STEM FEM ACCOLADE 38X102X30 S3 (Stem) ×2 IMPLANT
SUT TICRON 2-0 30IN 311381 (SUTURE) ×10 IMPLANT
SUT VIC AB 0 CT1 36 (SUTURE) ×2 IMPLANT
SUT VIC AB 2-0 CT2 27 (SUTURE) ×4 IMPLANT
SYR 10ML LL (SYRINGE) ×2 IMPLANT
TAPE MICROFOAM 4IN (TAPE) IMPLANT
TAPE TRANSPORE STRL 2 31045 (GAUZE/BANDAGES/DRESSINGS) IMPLANT
TIP BRUSH PULSAVAC PLUS 24.33 (MISCELLANEOUS) ×2 IMPLANT
TIP FAN IRRIG PULSAVAC PLUS (DISPOSABLE) ×1 IMPLANT
TUBE SUCT KAM VAC (TUBING) IMPLANT

## 2020-05-17 NOTE — Anesthesia Preprocedure Evaluation (Addendum)
Anesthesia Evaluation  Patient identified by MRN, date of birth, ID band Patient awake    Reviewed: Allergy & Precautions, H&P , NPO status , Patient's Chart, lab work & pertinent test results  Airway Mallampati: II      Comment: Small mouth Dental  (+) Upper Dentures, Lower Dentures   Pulmonary neg pulmonary ROS, neg COPD,    breath sounds clear to auscultation       Cardiovascular hypertension, (-) angina(-) Past MI (-) dysrhythmias  Rhythm:regular Rate:Normal     Neuro/Psych PSYCHIATRIC DISORDERS Dementia Dementia    GI/Hepatic negative GI ROS, Neg liver ROS,   Endo/Other  Hypothyroidism   Renal/GU CRFRenal disease     Musculoskeletal   Abdominal   Peds  Hematology negative hematology ROS (+)   Anesthesia Other Findings Past Medical History: No date: Dementia ([place]) No date: Hypertension No date: Thyroid disease  Past Surgical History: No date: ABDOMINAL HYSTERECTOMY  BMI    Body Mass Index: 14.63 kg/m      Reproductive/Obstetrics negative OB ROS                            Anesthesia Physical Anesthesia Plan  ASA: III  Anesthesia Plan: Spinal   Post-op Pain Management:    Induction:   PONV Risk Score and Plan:   Airway Management Planned:   Additional Equipment:   Intra-op Plan:   Post-operative Plan:   Informed Consent: I have reviewed the patients History and Physical, chart, labs and discussed the procedure including the risks, benefits and alternatives for the proposed anesthesia with the patient or authorized representative who has indicated his/her understanding and acceptance.     Dental Advisory Given  Plan Discussed with: Anesthesiologist, CRNA and Surgeon  Anesthesia Plan Comments:         Anesthesia Quick Evaluation

## 2020-05-17 NOTE — Transfer of Care (Signed)
Immediate Anesthesia Transfer of Care Note  Patient: Joanna Aguilar  Procedure(s) Performed: Hemi HIP ARTHROPLASTY (Right Hip)  Patient Location: PACU  Anesthesia Type:Spinal  Level of Consciousness: drowsy  Airway & Oxygen Therapy: Patient Spontanous Breathing and Patient connected to face mask oxygen  Post-op Assessment: Report given to RN  Post vital signs: stable  Last Vitals:  Vitals Value Taken Time  BP    Temp    Pulse 69 05/17/20 1529  Resp 12 05/17/20 1529  SpO2 98 % 05/17/20 1529  Vitals shown include unvalidated device data.  Last Pain:  Vitals:   05/17/20 1316  TempSrc: Tympanic  PainSc:          Complications: No complications documented.

## 2020-05-17 NOTE — Progress Notes (Signed)
Initial Nutrition Assessment  DOCUMENTATION CODES:   Underweight  INTERVENTION:  Ensure Enlive po daily, each supplement provides 350 kcal and 20 grams of protein Juven BID, each packet provides 95 calories, 2.5 grams of protein (collagen), and 9.8 grams of carbohydrate (3 grams sugar); also contains 7 grams of L-arginine and L-glutamine, 300 mg vitamin C, 15 mg vitamin E, 1.2 mcg vitamin B-12, 9.5 mg zinc, 200 mg calcium, and 1.5 g  Calcium Beta-hydroxy-Beta-methylbutyrate to support wound healing MVI with minerals daily   NUTRITION DIAGNOSIS:   Increased nutrient needs related to post-op healing, hip fracture as evidenced by estimated needs.    GOAL:   Patient will meet greater than or equal to 90% of their needs   MONITOR:   Labs, I & O's, Diet advancement, Weight trends, Skin  REASON FOR ASSESSMENT:   Consult Assessment of nutrition requirement/status  ASSESSMENT:  RD working remotely.  79 year old female admitted for closed displaced fracture of right femoral neck with past medical history of HTN, dementia, hypothyroidism, CKD stage III presented with right hip pain after mechanical fall at home.  Patient is NPO for scheduled for right hip hemiarthroplasty at 1:00 PM today. Per notes, pt is a poor historian with history of dementia. Daughter at bedside yesterday provided history. RD called pt room this morning, however no one picked up, unable to obtain nutrition history at this time. Will order Ensure supplement as well as Juven to support  post-op wound healing with diet advancement.  Per chart, usual body weight 88-99 lb over the past 2 years. Weights have trended down 9.46 lb (10.6%) in the past 5 weeks which is significant. Patient is underweight, given recent trends as well as history of dementia highly suspect malnutrition, however unable to identify at this time.   Medications and labs reviewed   NUTRITION - FOCUSED PHYSICAL EXAM: Unable to complete at this  time, RD working remotely.  Diet Order:   Diet Order            Diet NPO time specified Except for: Ice Chips, Sips with Meds  Diet effective now                 EDUCATION NEEDS:   No education needs have been identified at this time  Skin:  Skin Assessment: Reviewed RN Assessment  Last BM:  pta  Height:   Ht Readings from Last 1 Encounters:  05/16/20 5\' 2"  (1.575 m)    Weight:   Wt Readings from Last 1 Encounters:  05/16/20 36.3 kg    Ideal Body Weight:  50 kg  BMI:  Body mass index is 14.63 kg/m.  Estimated Nutritional Needs:   Kcal:  1300-1500  Protein:  65-75  Fluid:  >/= 1.2 L/day   Lajuan Lines, RD, LDN Clinical Nutrition After Hours/Weekend Pager # in [place]

## 2020-05-17 NOTE — Progress Notes (Signed)
Subjective:  POST OP CHECK: s/p right hip hemiarthroplasty.   Patient reports right hip pain as mild.  Patient's daughter is at the bedside.  Patient has been able to tolerate p.o. intake postop.  Objective:   VITALS:   Vitals:   05/17/20 1615 05/17/20 1638 05/17/20 1720 05/17/20 1816  BP: 140/87 134/86 129/90 114/81  Pulse: 65 78 82 91  Resp: 15 17 17 18   Temp: 98.3 F (36.8 C) 97.6 F (36.4 C) 97.7 F (36.5 C) 98.1 F (36.7 C)  TempSrc:  Oral Oral Oral  SpO2: 99% 98% 98% 98%  Weight:      Height:        PHYSICAL EXAM: Right lower extremity Neurovascular intact Sensation intact distally Intact pulses distally Dorsiflexion/Plantar flexion intact Incision: dressing C/D/I No cellulitis present Compartment soft  LABS  Results for orders placed or performed during the hospital encounter of 05/16/20 (from the past 24 hour(s))  Surgical PCR screen     Status: Abnormal   Collection Time: 05/16/20 11:34 PM   Specimen: Nasal Mucosa; Nasal Swab  Result Value Ref Range   MRSA, PCR NEGATIVE NEGATIVE   Staphylococcus aureus POSITIVE (A) NEGATIVE  Magnesium     Status: None   Collection Time: 05/17/20  6:00 AM  Result Value Ref Range   Magnesium 1.9 1.7 - 2.4 mg/dL  CBC     Status: Abnormal   Collection Time: 05/17/20  6:00 AM  Result Value Ref Range   WBC 7.6 4.0 - 10.5 K/uL   RBC 4.38 3.87 - 5.11 MIL/uL   Hemoglobin 11.8 (L) 12.0 - 15.0 g/dL   HCT 34.4 (L) 36 - 46 %   MCV 78.5 (L) 80.0 - 100.0 fL   MCH 26.9 26.0 - 34.0 pg   MCHC 34.3 30.0 - 36.0 g/dL   RDW 14.4 11.5 - 15.5 %   Platelets 169 150 - 400 K/uL   nRBC 0.0 0.0 - 0.2 %  Basic metabolic panel     Status: Abnormal   Collection Time: 05/17/20  6:00 AM  Result Value Ref Range   Sodium 137 135 - 145 mmol/L   Potassium 3.8 3.5 - 5.1 mmol/L   Chloride 98 98 - 111 mmol/L   CO2 27 22 - 32 mmol/L   Glucose, Bld 140 (H) 70 - 99 mg/dL   BUN 21 8 - 23 mg/dL   Creatinine, Ser 1.10 (H) 0.44 - 1.00 mg/dL   Calcium  9.5 8.9 - 10.3 mg/dL   GFR calc non Af Amer 49 (L) >60 mL/min   GFR calc Af Amer 56 (L) >60 mL/min   Anion gap 12 5 - 15  ABO/Rh     Status: None   Collection Time: 05/17/20  6:00 AM  Result Value Ref Range   ABO/RH(D)      B POS Performed at [facility], [address] [place], [place] [address]     CT Head Wo Contrast  Result Date: 05/16/2020 CLINICAL DATA:  Head trauma, headache, fall, dementia EXAM: CT HEAD WITHOUT CONTRAST CT CERVICAL SPINE WITHOUT CONTRAST TECHNIQUE: Multidetector CT imaging of the head and cervical spine was performed following the standard protocol without intravenous contrast. Multiplanar CT image reconstructions of the cervical spine were also generated. COMPARISON:  None. FINDINGS: CT HEAD FINDINGS Brain: No evidence of acute infarction, hemorrhage, hydrocephalus, extra-axial collection or mass lesion/mass effect. Extensive periventricular and deep white matter hypodensity. Mild global cerebral volume loss. Vascular: No hyperdense vessel or unexpected calcification. Skull:  Normal. Negative for fracture or focal lesion. Sinuses/Orbits: No acute finding. Other: None. CT CERVICAL SPINE FINDINGS Alignment: Normal. Skull base and vertebrae: No acute fracture. No primary bone lesion or focal pathologic process. Soft tissues and spinal canal: No prevertebral fluid or swelling. No visible canal hematoma. Disc levels:  Intact. Upper chest: Negative. Other: None. IMPRESSION: 1. No acute intracranial pathology. 2. Advanced small-vessel white matter disease and mild global cerebral volume loss. 3. No fracture or static subluxation of the cervical spine. Electronically Signed   By: Eddie Candle M.D.   On: 05/16/2020 12:58   CT Cervical Spine Wo Contrast  Result Date: 05/16/2020 CLINICAL DATA:  Head trauma, headache, fall, dementia EXAM: CT HEAD WITHOUT CONTRAST CT CERVICAL SPINE WITHOUT CONTRAST TECHNIQUE: Multidetector CT imaging of the head and cervical spine was  performed following the standard protocol without intravenous contrast. Multiplanar CT image reconstructions of the cervical spine were also generated. COMPARISON:  None. FINDINGS: CT HEAD FINDINGS Brain: No evidence of acute infarction, hemorrhage, hydrocephalus, extra-axial collection or mass lesion/mass effect. Extensive periventricular and deep white matter hypodensity. Mild global cerebral volume loss. Vascular: No hyperdense vessel or unexpected calcification. Skull: Normal. Negative for fracture or focal lesion. Sinuses/Orbits: No acute finding. Other: None. CT CERVICAL SPINE FINDINGS Alignment: Normal. Skull base and vertebrae: No acute fracture. No primary bone lesion or focal pathologic process. Soft tissues and spinal canal: No prevertebral fluid or swelling. No visible canal hematoma. Disc levels:  Intact. Upper chest: Negative. Other: None. IMPRESSION: 1. No acute intracranial pathology. 2. Advanced small-vessel white matter disease and mild global cerebral volume loss. 3. No fracture or static subluxation of the cervical spine. Electronically Signed   By: Eddie Candle M.D.   On: 05/16/2020 12:58   DG Chest Portable 1 View  Result Date: 05/16/2020 CLINICAL DATA:  Preop for hip fracture. EXAM: PORTABLE CHEST 1 VIEW COMPARISON:  December 11, 2018. FINDINGS: The heart size and mediastinal contours are within normal limits. Both lungs are clear. The visualized skeletal structures are unremarkable. IMPRESSION: No active disease. Electronically Signed   By: Marijo Conception M.D.   On: 05/16/2020 11:59   DG Hip Port Unilat With Pelvis 1V Right  Result Date: 05/17/2020 CLINICAL DATA:  Hemiarthroplasty EXAM: DG HIP (WITH OR WITHOUT PELVIS) 1V PORT RIGHT COMPARISON:  05/16/2020 FINDINGS: Interval right hip hemiarthroplasty with normal alignment. Gas in the soft tissues consistent with recent surgery. IMPRESSION: Interval right hip hemiarthroplasty with expected postsurgical change. Electronically Signed   By:  Donavan Foil M.D.   On: 05/17/2020 16:27   DG Hip Unilat W or Wo Pelvis 2-3 Views Right  Result Date: 05/16/2020 CLINICAL DATA:  Right hip pain after fall. EXAM: DG HIP (WITH OR WITHOUT PELVIS) 2-3V RIGHT COMPARISON:  None. FINDINGS: Moderately displaced fracture is seen involving proximal right femoral neck. No dislocation is noted. Left hip is unremarkable. IMPRESSION: Moderately displaced proximal right femoral neck fracture. Electronically Signed   By: Marijo Conception M.D.   On: 05/16/2020 11:58    Assessment/Plan: Day of Surgery   Principal Problem:   Closed displaced fracture of right femoral neck (HCC) Active Problems:   Essential hypertension   Dementia without behavioral disturbance (HCC)   Postablative hypothyroidism   Fall at home, initial encounter   Hypokalemia   CKD (chronic kidney disease), stage IIIa  Patient stable postop.  Postop x-rays demonstrate the hemiarthroplasty prosthesis appears well positioned and without evidence of postop complication.  Patient will complete 24 hours of  postop antibiotics.  Labs will be checked in the morning.  Patient will begin physical therapy tomorrow.  Her Foley catheter will be removed tomorrow.  Patient will begin Lovenox for DVT prophylaxis tomorrow.   [place] , MD 05/17/2020, 6:38 PM

## 2020-05-17 NOTE — Plan of Care (Signed)
  Problem: Education: Goal: Knowledge of General Education information will improve Description: Including pain rating scale, medication(s)/side effects and non-pharmacologic comfort measures Outcome: Progressing   Problem: Nutrition: Goal: Adequate nutrition will be maintained Outcome: Progressing   Problem: Safety: Goal: Ability to remain free from injury will improve Outcome: Progressing   

## 2020-05-17 NOTE — Progress Notes (Signed)
PROGRESS NOTE    Joanna Aguilar  GEZ:[id number]  DOB: 1941-04-22  DOA: 05/16/2020 PCP: Lavera Guise, MD Outpatient Specialists:   Hospital course:  Patient is a 79 year old female with dementia, HTN, hypothyroidism and CKD 3 who was admitted last night with right hip fracture after mechanical fall.   Subjective:  Patient is unable to give me any kind of meaningful history.  She does not know why she is here.  She does admit that she has hip pain.  She denies any chest pain or shortness of breath.   Objective: Vitals:   05/17/20 1615 05/17/20 1638 05/17/20 1720 05/17/20 1816  BP: 140/87 134/86 129/90 114/81  Pulse: 65 78 82 91  Resp: 15 17 17 18   Temp: 98.3 F (36.8 C) 97.6 F (36.4 C) 97.7 F (36.5 C) 98.1 F (36.7 C)  TempSrc:  Oral Oral Oral  SpO2: 99% 98% 98% 98%  Weight:      Height:        Intake/Output Summary (Last 24 hours) at 05/17/2020 1834 Last data filed at 05/17/2020 1600 Gross per 24 hour  Intake 1149.2 ml  Output 250 ml  Net 899.2 ml   Filed Weights   05/16/20 1202  Weight: 36.3 kg     Exam:  General: Frail elderly female lying flat in bed in no acute distress. Eyes: sclera anicteric, conjuctiva mild injection bilaterally CVS: S1-S2, regular  Respiratory:  decreased air entry bilaterally secondary to decreased inspiratory effort, rales at bases  GI: NABS, soft, NT  LE: No edema.    Assessment & Plan:   Closed displaced fracture of right femoral neck Status post right hip hemiarthroplasty earlier today, patient tolerated without immediate complication. DVT prophylaxis and pain management per orthopedics protocol. PT OT per orthopedics protocol  Hypokalemia: Repleted and normalized this morning  Essential hypertension Continue home doses of amlodipine  Dementia without behavioral disturbance (HCC) Continue home doses of donepezil and Namenda  Postablative hypothyroidism Continue Synthroid  CKD stage IIIa Kidney function  appears to be at baseline Avoid renal toxic medication and or dehydration   DVT prophylaxis: Enoxaparin Code Status: DNR Family Communication: Dr. Mack Guise discussed patient's care with her daughter earlier today Disposition Plan:   Patient is from: Home  Anticipated Discharge Location: SNF  Barriers to Discharge: Status post hemiarthroplasty earlier today  Is patient medically stable for Discharge: No   Consultants:  Orthopedic  Procedures:  Status post right hemiarthroplasty of hip  Antimicrobials:  None   Data Reviewed:  Basic Metabolic Panel: Recent Labs  Lab 05/16/20 1130 05/17/20 0600  NA 139 137  K 3.0* 3.8  CL 101 98  CO2 25 27  GLUCOSE 144* 140*  BUN 21 21  CREATININE 1.15* 1.10*  CALCIUM 9.1 9.5  MG  --  1.9   Liver Function Tests: Recent Labs  Lab 05/16/20 1130  AST 25  ALT 14  ALKPHOS 58  BILITOT 0.9  PROT 7.7  ALBUMIN 4.0   No results for input(s): LIPASE, AMYLASE in the last 168 hours. No results for input(s): AMMONIA in the last 168 hours. CBC: Recent Labs  Lab 05/16/20 1130 05/17/20 0600  WBC 6.7 7.6  NEUTROABS 5.4  --   HGB 10.7* 11.8*  HCT 31.9* 34.4*  MCV 80.2 78.5*  PLT 162 169   Cardiac Enzymes: No results for input(s): CKTOTAL, CKMB, CKMBINDEX, TROPONINI in the last 168 hours. BNP (last 3 results) No results for input(s): PROBNP in the last 8760 hours. CBG:  No results for input(s): GLUCAP in the last 168 hours.  Recent Results (from the past 240 hour(s))  SARS Coronavirus 2 by RT PCR (hospital order, performed in [facility] hospital lab) Nasopharyngeal Nasopharyngeal Swab     Status: None   Collection Time: 05/16/20 11:30 AM   Specimen: Nasopharyngeal Swab  Result Value Ref Range Status   SARS Coronavirus 2 NEGATIVE NEGATIVE Final    Comment: (NOTE) SARS-CoV-2 target nucleic acids are NOT DETECTED.  The SARS-CoV-2 RNA is generally detectable in upper and lower respiratory specimens during the acute phase of  infection. The lowest concentration of SARS-CoV-2 viral copies this assay can detect is 250 copies / mL. A negative result does not preclude SARS-CoV-2 infection and should not be used as the sole basis for treatment or other patient management decisions.  A negative result may occur with improper specimen collection / handling, submission of specimen other than nasopharyngeal swab, presence of viral mutation(s) within the areas targeted by this assay, and inadequate number of viral copies (<250 copies / mL). A negative result must be combined with clinical observations, patient history, and epidemiological information.  Fact Sheet for Patients:   StrictlyIdeas.no  Fact Sheet for Healthcare Providers: BankingDealers.co.za  This test is not yet approved or  cleared by the [place] FDA and has been authorized for detection and/or diagnosis of SARS-CoV-2 by FDA under an Emergency Use Authorization (EUA).  This EUA will remain in effect (meaning this test can be used) for the duration of the COVID-19 declaration under Section 564(b)(1) of the Act, 21 U.S.C. section 360bbb-3(b)(1), unless the authorization is terminated or revoked sooner.  Performed at [facility], [address] [place], [place] [address]   Surgical PCR screen     Status: Abnormal   Collection Time: 05/16/20 11:34 PM   Specimen: Nasal Mucosa; Nasal Swab  Result Value Ref Range Status   MRSA, PCR NEGATIVE NEGATIVE Final   Staphylococcus aureus POSITIVE (A) NEGATIVE Final    Comment: (NOTE) The Xpert SA Assay (FDA approved for NASAL specimens in patients 43 years of age and older), is one component of a comprehensive surveillance program. It is not intended to diagnose infection nor to guide or monitor treatment. Performed at [facility], [address] [place], [place] [address]       Studies: CT Head Wo Contrast  Result Date:  05/16/2020 CLINICAL DATA:  Head trauma, headache, fall, dementia EXAM: CT HEAD WITHOUT CONTRAST CT CERVICAL SPINE WITHOUT CONTRAST TECHNIQUE: Multidetector CT imaging of the head and cervical spine was performed following the standard protocol without intravenous contrast. Multiplanar CT image reconstructions of the cervical spine were also generated. COMPARISON:  None. FINDINGS: CT HEAD FINDINGS Brain: No evidence of acute infarction, hemorrhage, hydrocephalus, extra-axial collection or mass lesion/mass effect. Extensive periventricular and deep white matter hypodensity. Mild global cerebral volume loss. Vascular: No hyperdense vessel or unexpected calcification. Skull: Normal. Negative for fracture or focal lesion. Sinuses/Orbits: No acute finding. Other: None. CT CERVICAL SPINE FINDINGS Alignment: Normal. Skull base and vertebrae: No acute fracture. No primary bone lesion or focal pathologic process. Soft tissues and spinal canal: No prevertebral fluid or swelling. No visible canal hematoma. Disc levels:  Intact. Upper chest: Negative. Other: None. IMPRESSION: 1. No acute intracranial pathology. 2. Advanced small-vessel white matter disease and mild global cerebral volume loss. 3. No fracture or static subluxation of the cervical spine. Electronically Signed   By: Eddie Candle M.D.   On: 05/16/2020 12:58  CT Cervical Spine Wo Contrast  Result Date: 05/16/2020 CLINICAL DATA:  Head trauma, headache, fall, dementia EXAM: CT HEAD WITHOUT CONTRAST CT CERVICAL SPINE WITHOUT CONTRAST TECHNIQUE: Multidetector CT imaging of the head and cervical spine was performed following the standard protocol without intravenous contrast. Multiplanar CT image reconstructions of the cervical spine were also generated. COMPARISON:  None. FINDINGS: CT HEAD FINDINGS Brain: No evidence of acute infarction, hemorrhage, hydrocephalus, extra-axial collection or mass lesion/mass effect. Extensive periventricular and deep white matter  hypodensity. Mild global cerebral volume loss. Vascular: No hyperdense vessel or unexpected calcification. Skull: Normal. Negative for fracture or focal lesion. Sinuses/Orbits: No acute finding. Other: None. CT CERVICAL SPINE FINDINGS Alignment: Normal. Skull base and vertebrae: No acute fracture. No primary bone lesion or focal pathologic process. Soft tissues and spinal canal: No prevertebral fluid or swelling. No visible canal hematoma. Disc levels:  Intact. Upper chest: Negative. Other: None. IMPRESSION: 1. No acute intracranial pathology. 2. Advanced small-vessel white matter disease and mild global cerebral volume loss. 3. No fracture or static subluxation of the cervical spine. Electronically Signed   By: Eddie Candle M.D.   On: 05/16/2020 12:58   DG Chest Portable 1 View  Result Date: 05/16/2020 CLINICAL DATA:  Preop for hip fracture. EXAM: PORTABLE CHEST 1 VIEW COMPARISON:  December 11, 2018. FINDINGS: The heart size and mediastinal contours are within normal limits. Both lungs are clear. The visualized skeletal structures are unremarkable. IMPRESSION: No active disease. Electronically Signed   By: Marijo Conception M.D.   On: 05/16/2020 11:59   DG Hip Port Unilat With Pelvis 1V Right  Result Date: 05/17/2020 CLINICAL DATA:  Hemiarthroplasty EXAM: DG HIP (WITH OR WITHOUT PELVIS) 1V PORT RIGHT COMPARISON:  05/16/2020 FINDINGS: Interval right hip hemiarthroplasty with normal alignment. Gas in the soft tissues consistent with recent surgery. IMPRESSION: Interval right hip hemiarthroplasty with expected postsurgical change. Electronically Signed   By: Donavan Foil M.D.   On: 05/17/2020 16:27   DG Hip Unilat W or Wo Pelvis 2-3 Views Right  Result Date: 05/16/2020 CLINICAL DATA:  Right hip pain after fall. EXAM: DG HIP (WITH OR WITHOUT PELVIS) 2-3V RIGHT COMPARISON:  None. FINDINGS: Moderately displaced fracture is seen involving proximal right femoral neck. No dislocation is noted. Left hip is  unremarkable. IMPRESSION: Moderately displaced proximal right femoral neck fracture. Electronically Signed   By: Marijo Conception M.D.   On: 05/16/2020 11:58     Scheduled Meds: . acetaminophen  500 mg Oral Q6H  . amLODipine  5 mg Oral Daily  . Chlorhexidine Gluconate Cloth  6 each Topical Q0600  . docusate sodium  100 mg Oral BID  . donepezil  10 mg Oral Daily  . [START ON 05/18/2020] enoxaparin (LOVENOX) injection  30 mg Subcutaneous Q24H  . [START ON 05/18/2020] feeding supplement (ENSURE ENLIVE)  237 mL Oral Q24H  . levothyroxine  88 mcg Oral Daily  . memantine  5 mg Oral Daily  . multivitamin with minerals  1 tablet Oral Daily  . mupirocin ointment  1 application Nasal BID  . [START ON 05/18/2020] nutrition supplement (JUVEN)  1 packet Oral BID BM  . senna  1 tablet Oral BID  . sodium chloride flush      . traMADol  50 mg Oral Q6H   Continuous Infusions: . sodium chloride 75 mL/hr at 05/16/20 1953  .  ceFAZolin (ANCEF) IV      Principal Problem:   Closed displaced fracture of right femoral neck (HCC)  Active Problems:   Essential hypertension   Dementia without behavioral disturbance ([place])   Postablative hypothyroidism   Fall at home, initial encounter   Hypokalemia   CKD (chronic kidney disease), stage IIIa     River Ambrosio Tublu Dontay Harm, [facility]  If 7PM-7AM, please contact night-coverage [email or web address] Password [facility] 05/17/2020, 6:34 PM    LOS: 1 day

## 2020-05-17 NOTE — Op Note (Signed)
05/17/2020  3:26 PM  PATIENT:  Joanna Aguilar   MRN: [id number]  PRE-OPERATIVE DIAGNOSIS:  Right displaced femoral neck hip fracture  POST-OPERATIVE DIAGNOSIS: Same  PROCEDURE:  Procedure(s): Right hip hemiarthroplasty  PREOPERATIVE INDICATIONS:    Joanna Aguilar is an 79 y.o. female who was admitted with a diagnosis of Right femoral neck hip Fracture.  I have recommended surgical fixation with hemiarthroplasty for treatment for this fracture. I have explained the surgery and the postoperative course to the patient and their daughter who have agreed with surgical management of this fracture.    The risks benefits and alternatives were discussed with the patient and her daughter including but not limited to the risks of infection requiring removal of the prosthesis, bleeding requiring blood transfusion, nerve injury especially to the sciatic nerve leading to foot drop or lower extremity numbness, periprosthetic fracture, dislocation, leg length discrepancy, change in lower extremity rotation persistent hip pain, loosening or failure of the components and the need for revision surgery. Medical risks include but are not limited to DVT and pulmonary embolism, myocardial infarction, stroke, pneumonia, respiratory failure and death.  OPERATIVE REPORT     SURGEON:  Thornton Park, MD    ASSISTANT: Surgical tech    ANESTHESIA: Spinal    COMPLICATIONS:  None.   SPECIMEN: Femoral head to pathology    COMPONENTS:  Stryker Accolade 2 femoral component size 3 with a Stryker Unitrax 46 mm outer head with + 4 neck adjustment sleeve.    PROCEDURE IN DETAIL:   The patient was met in the holding area and  identified.  The appropriate hip was identified and marked at the operative site after verbally confirming with the patient that this was the correct site of surgery.  The patient was then transported to the OR  and underwent spinal anesthesia.  The patient was then placed in the lateral  decubitus position with the operative side up and secured on the operating room table with a pegboard and all bony prominences were adequately padded. This included an axillary roll and additional padding around the nonoperative leg to prevent compression to the common peroneal nerve.    The operative lower extremity was prepped and draped in a sterile fashion.  A time out was performed prior to incision to verify patient's name, date of birth, medical record number, correct site of surgery correct procedure to be performed. The timeout was also used to verify the patient received antibiotics now appropriate instruments, implants and radiographic studies were available in the room. Once all in attendance were in agreement case began.  Patient received 2 g of Ancef prior to the onset of the case.  A posterolateral approach was utilized via sharp dissection  carried down to the subcutaneous tissue.  Bleeding vessels were coagulated using electrocautery.  The fascia lata was identified and incised along the length of the skin incision.  The gluteus maximus muscle was then split in line with its fibers. Self-retaining retractors were  inserted.  With the hip internally rotated, the short external rotators  were identified and removed from the posterior attachment from the greater trochanter. The piriformis was tagged for later repair. The capsule was identified and a T-shaped capsulotomy was performed. The capsule was tagged with #2 Tycron for later repair.  The femoral neck fracture was exposed, and the femoral head was removed using a corkscrew device. This was measured to be 46 mm in diameter. The attention was then turned to proximal femur preparation.  An  oscillating saw was used to perform a proximal femoral osteotomy 1 fingerbreadth above the lesser trochanter. The trial 46 mm femoral head was placed into the acetabulum and had an excellent suction fit. The attention was then turned back to femoral  preparation.    A femoral skid and Cobra retractor were placed under the femoral neck to allow for adequate visualization. A box osteotome was used to make the initial entry into the proximal femur. A single hand reamer was used to prepare the femoral canal. A T-shaped femoral canal sounder was then used to ensure no penetration femoral cortex had occurred during reaming. The proximal femur was then sequentially broached by hand. A size 3 femoral trial broach was found to have best medial to lateral canal fit. Once adequate mediolateral canal fill was achieved the trial femoral broach, neck, and head was assembled and the hip was reduced. It was found to have excellent stability, equivalent leg lengths with functional range of motion. The trial components were then removed.  I copiously irrigated the femoral canal and then impacted the real femoral prosthesis into place into the appropriate version, slightly anteverted to the normal anatomy, and I impacted the actual 46 mm Unitrax femoral component with a +4 neck adjustment sleeve into place. The hip was then reduced and taken through functional range of motion and found to have excellent stability. Leg lengths were restored. The hip joint was copiously irrigated.   A soft tissue repair of the capsule and external rotators was performed using #2 Tycron Excellent posterior capsular repair was achieved. The fascia lata was then closed with interrupted 0 Vicryl suture. The subcutaneous tissues were closed with 2-0 Vicryl and the skin approximated with staples.   The patient was then placed supine on the operative table. Leg lengths were checked clinically and found to be equivalent. An abduction pillow was placed between the lower extremities. The patient was then transferred to a hospital bed and brought to the PACU in stable condition. I was scrubbed and present the entire case and all sharp and instrument counts were correct at the conclusion of the case. I  spoke with the patient's daughter in the postop consultation room to let her know the case was completed without complication patient was stable in recovery room.   Joanna Gaul, MD Orthopedic Surgeon

## 2020-05-18 ENCOUNTER — Encounter: Payer: Self-pay | Admitting: Orthopedic Surgery

## 2020-05-18 LAB — CBC
HCT: 29.9 % — ABNORMAL LOW (ref 36.0–46.0)
Hemoglobin: 9.9 g/dL — ABNORMAL LOW (ref 12.0–15.0)
MCH: 26.2 pg (ref 26.0–34.0)
MCHC: 33.1 g/dL (ref 30.0–36.0)
MCV: 79.1 fL — ABNORMAL LOW (ref 80.0–100.0)
Platelets: 159 10*3/uL (ref 150–400)
RBC: 3.78 MIL/uL — ABNORMAL LOW (ref 3.87–5.11)
RDW: 14.4 % (ref 11.5–15.5)
WBC: 8.4 10*3/uL (ref 4.0–10.5)
nRBC: 0 % (ref 0.0–0.2)

## 2020-05-18 LAB — BASIC METABOLIC PANEL
Anion gap: 8 (ref 5–15)
BUN: 33 mg/dL — ABNORMAL HIGH (ref 8–23)
CO2: 27 mmol/L (ref 22–32)
Calcium: 8.9 mg/dL (ref 8.9–10.3)
Chloride: 100 mmol/L (ref 98–111)
Creatinine, Ser: 1.53 mg/dL — ABNORMAL HIGH (ref 0.44–1.00)
GFR calc Af Amer: 38 mL/min — ABNORMAL LOW (ref >60)
GFR calc non Af Amer: 33 mL/min — ABNORMAL LOW (ref >60)
Glucose, Bld: 114 mg/dL — ABNORMAL HIGH (ref 70–99)
Potassium: 3.9 mmol/L (ref 3.5–5.1)
Sodium: 135 mmol/L (ref 135–145)

## 2020-05-18 MED ORDER — POLYETHYLENE GLYCOL 3350 17 G PO PACK
17.0000 g | PACK | Freq: Every day | ORAL | Status: DC
  Administered 2020-05-18 – 2020-05-22 (×5): 17 g via ORAL
  Filled 2020-05-18 (×5): qty 1

## 2020-05-18 NOTE — Progress Notes (Signed)
Physical Therapy Evaluation Patient Details Name: Joanna Aguilar MRN: [id number] DOB: Jun 14, 1941 Today's Date: 05/18/2020   History of Present Illness  Pt. is a 79 y.o. female who was admitted to [facility] for right hip hemiarthroplasty repair of a displaced femoral neck fracture sustained after a fall down her attic steps. PMHx includes: HTN, Dementia, Hypothyroidism, CKD, Hypokalemia, Pt. has posterior hip precautions.  Clinical Impression  Pt admitted with above diagnosis. Pt currently with functional limitations due to the deficits listed below (see PT Problem List). Pt is able to complete all exercises as instructed with heavy assist for RLE. Pt reporting increase in R hip pain with all movement. During bed mobility she is limited by pain and requires assist for BLE and trunk management when going from supine to sitting at EOB. Once upright she maintains balance independently. She requires maxA+1 initially during transfer due to reluctance to bear weight on RLE. She requires maxA+1 to shuffle her feet to get from the bed to the recliner but is unable to truly ambulate at this time. She will need SNF placement at discharge in order to facilitate safe return home. She will benefit from PT services to address deficits in strength, balance, and mobility in order to return to full function at home.      Follow Up Recommendations SNF    Equipment Recommendations  Rolling walker with 5" wheels;Other (comment) (TBD at next venue)    Recommendations for Other Services       Precautions / Restrictions Precautions Precautions: Posterior Hip Restrictions Weight Bearing Restrictions: Yes RLE Weight Bearing: Weight bearing as tolerated      Mobility  Bed Mobility Overal bed mobility: Needs Assistance Bed Mobility: Supine to Sit     Supine to sit: Mod assist     General bed mobility comments: Pt limited by pain and requires assist for BLE and trunk management when going from supine to sitting  at EOB. Once upright she maintains balance independently.   Transfers Overall transfer level: Needs assistance Equipment used: Rolling walker (2 wheeled) Transfers: Sit to/from Stand Sit to Stand: Max assist         General transfer comment: Pt requires maxA+1 initially during transfer due to reluctance to bear weight on RLE. She requires maxA+1 to shuffle her feet to get from the bed to the recliner but is unabel to truly ambulate at this time.   Ambulation/Gait                Stairs            Wheelchair Mobility    Modified Rankin (Stroke Patients Only)       Balance Overall balance assessment: Needs assistance Sitting-balance support: No upper extremity supported Sitting balance-Leahy Scale: Fair     Standing balance support: Bilateral upper extremity supported Standing balance-Leahy Scale: Poor Standing balance comment: Pt requires at least minA+1 to maintain standing balance                             Pertinent Vitals/Pain Pain Assessment: Faces Faces Pain Scale: Hurts even more Pain Location: R hip with functional mobility Pain Descriptors / Indicators: Grimacing Pain Intervention(s): Monitored during session    Home Living Family/patient expects to be discharged to:: Private residence Living Arrangements: Children Available Help at Discharge: Family Type of Home: House Home Access: Stairs to enter Entrance Stairs-Rails: Left Entrance Stairs-Number of Steps: 2 Home Layout: One level Home Equipment: None  Prior Function Level of Independence: Needs assistance   Gait / Transfers Assistance Needed: Pt. ambulated without an assistive device around the house and outside. She reports 2 falls in the last 12 months  ADL's / Homemaking Assistance Needed: Pt. was independent with ADLs, light homemaking, gardening, maintains her flower beds, and mowing. Pt. family assists with cooking, and medication management. Pt. does not  drive.        Hand Dominance   Dominant Hand: Right    Extremity/Trunk Assessment   Upper Extremity Assessment Upper Extremity Assessment: Generalized weakness    Lower Extremity Assessment Lower Extremity Assessment: Generalized weakness;RLE deficits/detail RLE Deficits / Details: Difficult to assess RLE strength due to pain. However pt is able to perform active quad and glut contraction as well as assist with SLR and hip abduction       Communication   Communication: No difficulties  Cognition Arousal/Alertness: Lethargic Behavior During Therapy: WFL for tasks assessed/performed Overall Cognitive Status: History of cognitive impairments - at baseline                                 General Comments: Pt is AOx2 which is roughly close to her baseline      General Comments      Exercises Total Joint Exercises Ankle Circles/Pumps: AROM;Both;10 reps Quad Sets: Strengthening;Both;10 reps Gluteal Sets: Strengthening;Both;10 reps Towel Squeeze: Strengthening;Both;10 reps Short Arc Quad: Strengthening;Right;10 reps Heel Slides: Strengthening;Right;10 reps Hip ABduction/ADduction: Strengthening;Right;10 reps Straight Leg Raises: Strengthening;Right;10 reps   Assessment/Plan    PT Assessment Patient needs continued PT services  PT Problem List Decreased strength;Decreased activity tolerance;Decreased balance;Decreased mobility       PT Treatment Interventions DME instruction;Gait training;Functional mobility training;Therapeutic activities;Therapeutic exercise;Balance training;Neuromuscular re-education;Cognitive remediation    PT Goals (Current goals can be found in the Care Plan section)  Acute Rehab PT Goals Patient Stated Goal: To regain independence PT Goal Formulation: With patient/family Time For Goal Achievement: 06/01/20 Potential to Achieve Goals: Fair    Frequency BID   Barriers to discharge        Co-evaluation                AM-PAC PT "6 Clicks" Mobility  Outcome Measure Help needed turning from your back to your side while in a flat bed without using bedrails?: A Lot Help needed moving from lying on your back to sitting on the side of a flat bed without using bedrails?: A Lot Help needed moving to and from a bed to a chair (including a wheelchair)?: A Lot Help needed standing up from a chair using your arms (e.g., wheelchair or bedside chair)?: A Lot Help needed to walk in hospital room?: Total Help needed climbing 3-5 steps with a railing? : Total 6 Click Score: 10    End of Session Equipment Utilized During Treatment: Gait belt Activity Tolerance: Patient tolerated treatment well Patient left: with call bell/phone within reach;with chair alarm set;with family/visitor present;in chair Nurse Communication: Mobility status PT Visit Diagnosis: Unsteadiness on feet (R26.81);Muscle weakness (generalized) (M62.81);Difficulty in walking, not elsewhere classified (R26.2);Pain Pain - Right/Left: Right Pain - part of body: Hip    Time: 3546-5681 PT Time Calculation (min) (ACUTE ONLY): 38 min   Charges:   PT Evaluation $PT Eval Low Complexity: 1 Low PT Treatments $Therapeutic Exercise: 8-22 mins        Lyndel Safe Huprich PT, DPT, GCS   Huprich,Jason 05/18/2020, 2:58 PM

## 2020-05-18 NOTE — Progress Notes (Signed)
Subjective:  Patient reports pain as mild.  H/H stable. Patient nonsymptomatic  Objective:   VITALS:   Vitals:   05/17/20 2031 05/17/20 2337 05/18/20 0431 05/18/20 0801  BP: 121/78 109/82 125/90 124/84  Pulse: 87 88 85 88  Resp: 17 16 17 17   Temp: 97.7 F (36.5 C) (!) 97.5 F (36.4 C) 97.7 F (36.5 C) 98.2 F (36.8 C)  TempSrc: Oral Oral Oral Axillary  SpO2: 99% 100% 98% 98%  Weight:      Height:        PHYSICAL EXAM:  Neurologically intact ABD soft Neurovascular intact Sensation intact distally Intact pulses distally Dorsiflexion/Plantar flexion intact Incision: dressing C/D/I No cellulitis present Compartment soft  LABS  Results for orders placed or performed during the hospital encounter of 05/16/20 (from the past 24 hour(s))  CBC     Status: Abnormal   Collection Time: 05/18/20  4:04 AM  Result Value Ref Range   WBC 8.4 4.0 - 10.5 K/uL   RBC 3.78 (L) 3.87 - 5.11 MIL/uL   Hemoglobin 9.9 (L) 12.0 - 15.0 g/dL   HCT 29.9 (L) 36 - 46 %   MCV 79.1 (L) 80.0 - 100.0 fL   MCH 26.2 26.0 - 34.0 pg   MCHC 33.1 30.0 - 36.0 g/dL   RDW 14.4 11.5 - 15.5 %   Platelets 159 150 - 400 K/uL   nRBC 0.0 0.0 - 0.2 %  Basic metabolic panel     Status: Abnormal   Collection Time: 05/18/20  4:04 AM  Result Value Ref Range   Sodium 135 135 - 145 mmol/L   Potassium 3.9 3.5 - 5.1 mmol/L   Chloride 100 98 - 111 mmol/L   CO2 27 22 - 32 mmol/L   Glucose, Bld 114 (H) 70 - 99 mg/dL   BUN 33 (H) 8 - 23 mg/dL   Creatinine, Ser 1.53 (H) 0.44 - 1.00 mg/dL   Calcium 8.9 8.9 - 10.3 mg/dL   GFR calc non Af Amer 33 (L) >60 mL/min   GFR calc Af Amer 38 (L) >60 mL/min   Anion gap 8 5 - 15    CT Head Wo Contrast  Result Date: 05/16/2020 CLINICAL DATA:  Head trauma, headache, fall, dementia EXAM: CT HEAD WITHOUT CONTRAST CT CERVICAL SPINE WITHOUT CONTRAST TECHNIQUE: Multidetector CT imaging of the head and cervical spine was performed following the standard protocol without intravenous  contrast. Multiplanar CT image reconstructions of the cervical spine were also generated. COMPARISON:  None. FINDINGS: CT HEAD FINDINGS Brain: No evidence of acute infarction, hemorrhage, hydrocephalus, extra-axial collection or mass lesion/mass effect. Extensive periventricular and deep white matter hypodensity. Mild global cerebral volume loss. Vascular: No hyperdense vessel or unexpected calcification. Skull: Normal. Negative for fracture or focal lesion. Sinuses/Orbits: No acute finding. Other: None. CT CERVICAL SPINE FINDINGS Alignment: Normal. Skull base and vertebrae: No acute fracture. No primary bone lesion or focal pathologic process. Soft tissues and spinal canal: No prevertebral fluid or swelling. No visible canal hematoma. Disc levels:  Intact. Upper chest: Negative. Other: None. IMPRESSION: 1. No acute intracranial pathology. 2. Advanced small-vessel white matter disease and mild global cerebral volume loss. 3. No fracture or static subluxation of the cervical spine. Electronically Signed   By: Eddie Candle M.D.   On: 05/16/2020 12:58   CT Cervical Spine Wo Contrast  Result Date: 05/16/2020 CLINICAL DATA:  Head trauma, headache, fall, dementia EXAM: CT HEAD WITHOUT CONTRAST CT CERVICAL SPINE WITHOUT CONTRAST TECHNIQUE: Multidetector CT imaging of  the head and cervical spine was performed following the standard protocol without intravenous contrast. Multiplanar CT image reconstructions of the cervical spine were also generated. COMPARISON:  None. FINDINGS: CT HEAD FINDINGS Brain: No evidence of acute infarction, hemorrhage, hydrocephalus, extra-axial collection or mass lesion/mass effect. Extensive periventricular and deep white matter hypodensity. Mild global cerebral volume loss. Vascular: No hyperdense vessel or unexpected calcification. Skull: Normal. Negative for fracture or focal lesion. Sinuses/Orbits: No acute finding. Other: None. CT CERVICAL SPINE FINDINGS Alignment: Normal. Skull base and  vertebrae: No acute fracture. No primary bone lesion or focal pathologic process. Soft tissues and spinal canal: No prevertebral fluid or swelling. No visible canal hematoma. Disc levels:  Intact. Upper chest: Negative. Other: None. IMPRESSION: 1. No acute intracranial pathology. 2. Advanced small-vessel white matter disease and mild global cerebral volume loss. 3. No fracture or static subluxation of the cervical spine. Electronically Signed   By: Eddie Candle M.D.   On: 05/16/2020 12:58   DG Chest Portable 1 View  Result Date: 05/16/2020 CLINICAL DATA:  Preop for hip fracture. EXAM: PORTABLE CHEST 1 VIEW COMPARISON:  December 11, 2018. FINDINGS: The heart size and mediastinal contours are within normal limits. Both lungs are clear. The visualized skeletal structures are unremarkable. IMPRESSION: No active disease. Electronically Signed   By: Marijo Conception M.D.   On: 05/16/2020 11:59   DG Hip Port Unilat With Pelvis 1V Right  Result Date: 05/17/2020 CLINICAL DATA:  Hemiarthroplasty EXAM: DG HIP (WITH OR WITHOUT PELVIS) 1V PORT RIGHT COMPARISON:  05/16/2020 FINDINGS: Interval right hip hemiarthroplasty with normal alignment. Gas in the soft tissues consistent with recent surgery. IMPRESSION: Interval right hip hemiarthroplasty with expected postsurgical change. Electronically Signed   By: Donavan Foil M.D.   On: 05/17/2020 16:27   DG Hip Unilat W or Wo Pelvis 2-3 Views Right  Result Date: 05/16/2020 CLINICAL DATA:  Right hip pain after fall. EXAM: DG HIP (WITH OR WITHOUT PELVIS) 2-3V RIGHT COMPARISON:  None. FINDINGS: Moderately displaced fracture is seen involving proximal right femoral neck. No dislocation is noted. Left hip is unremarkable. IMPRESSION: Moderately displaced proximal right femoral neck fracture. Electronically Signed   By: Marijo Conception M.D.   On: 05/16/2020 11:58    Assessment/Plan: 1 Day Post-Op   Principal Problem:   Closed displaced fracture of right femoral neck  (HCC) Active Problems:   Essential hypertension   Dementia without behavioral disturbance (HCC)   Postablative hypothyroidism   Fall at home, initial encounter   Hypokalemia   CKD (chronic kidney disease), stage IIIa   Advance diet Up with therapy  Foley catheter will be removed today.   Patient will begin Lovenox for DVT prophylaxis. Starts 05/18/20 dose per pharmacy X 2 weeks. WBAT right LE Hydrocodone 5/325 Q4H prn pain Discharge per medicine to SNF Follow up in 2 weeks with Dr. Harden Mo office.  Call to confirm appointment [phone]    Ahlani Wickes , PA-C 05/18/2020, 8:57 AM

## 2020-05-18 NOTE — Progress Notes (Signed)
Pt has not voided since around 0600 today.  Bladder scan showed 243ml no standing orders to refer to so MD made aware.  Awaiting response at this time.  Pt in NAD resting soundly in bed.

## 2020-05-18 NOTE — Evaluation (Signed)
Occupational Therapy Evaluation Patient Details Name: Joanna Aguilar MRN: [id number] DOB: 01-May-1941 Today's Date: 05/18/2020    History of Present Illness Pt. is a 79 y.o. female wh was admitted to [facility] for Right  Hip Hemiarthroplasty repair of a Displaced femoral neck fracture sustained after a fall down her attic steps. PMHx includes: HTN, Dementia, Hypothyroidism, CKD, Hypokalemia, Pt. has posterior hip precautions.   Clinical Impression   Pt. presents with weakness, limited activity tolerance, limited cognitive function, and limited functional mobility which hinders her ability to complete basic ADL and IADL functioning. Pt. Resides at home with her daughter. Pt. Was independent with basic ADL morning care, and some IADL tasks including: light homemaking, gardening, and flower bed management, and mowing. Pt. Has assist with meal preparation, and medication management skills from her daughter. Prior to the pandemic, the pt. Attended classes at [facility]. Pt. does not drive. Pt., and daughter education was provided about posterior hip precautions for ADLs, and IADLs. Pt. required minA self feeding skills with complete set-up, and frequent cues.  Pt. could benefit from OT services for ADL training, A/E training, and pt./caregiver education about cognitive compensatory strategies during ADLs/IADLs, posterior hip precautions, home modification, and DME. Pt. would benefit from SNF level of care upon discharge, with follow-up OT services.    Follow Up Recommendations  SNF    Equipment Recommendations    Bedside Commode   Recommendations for Other Services       Precautions / Restrictions Precautions Precautions: Posterior Hip Restrictions Weight Bearing Restrictions: Yes RLE Weight Bearing: Weight bearing as tolerated      Mobility Bed Mobility Overal bed mobility: Needs Assistance             General bed mobility comments: deferred. Pt. seen for bedside  eval  Transfers                 General transfer comment: Deffered    Balance                                           ADL either performed or assessed with clinical judgement   ADL Overall ADL's : Needs assistance/impaired Eating/Feeding: Set up;Minimal assistance;Bed level   Grooming: Set up;Minimal assistance;Bed level           Upper Body Dressing : Set up;Moderate assistance   Lower Body Dressing: Set up;Maximal assistance                       Vision Baseline Vision/History: Wears glasses Wears Glasses: At all times Patient Visual Report: No change from baseline       Perception     Praxis      Pertinent Vitals/Pain Pain Assessment: No/denies pain     Hand Dominance Right   Extremity/Trunk Assessment Upper Extremity Assessment Upper Extremity Assessment: Generalized weakness           Communication Communication Communication: No difficulties   Cognition Arousal/Alertness: Lethargic Behavior During Therapy: WFL for tasks assessed/performed Overall Cognitive Status: History of cognitive impairments - at baseline                                     General Comments       Exercises     Shoulder Instructions  Home Living Family/patient expects to be discharged to:: Private residence Living Arrangements: Children Available Help at Discharge: Family Type of Home: [place]: None          Prior Functioning/Environment Level of Independence: Needs assistance  Gait / Transfers Assistance Needed: Pt. ambulated without an assistive device around the house ADL's / Homemaking Assistance Needed: Pt. was independent with ADLs, light homemaking, gardening, maintains her flower beds, and mowing. Pt. family assists with cooking, and medication management. Pt. does not drive.            OT Problem List: Decreased strength;Decreased range of  motion;Decreased knowledge of use of DME or AE;Decreased knowledge of precautions;Decreased activity tolerance      OT Treatment/Interventions: Self-care/ADL training;Therapeutic exercise;Patient/family education;DME and/or AE instruction;Therapeutic activities;Cognitive remediation/compensation    OT Goals(Current goals can be found in the care plan section) Acute Rehab OT Goals Patient Stated Goal: To regain independence OT Goal Formulation: With patient Potential to Achieve Goals: Good  OT Frequency: Min 2X/week   Barriers to D/C:            Co-evaluation              AM-PAC OT "6 Clicks" Daily Activity     Outcome Measure Help from another person eating meals?: A Little Help from another person taking care of personal grooming?: A Little Help from another person toileting, which includes using toliet, bedpan, or urinal?: A Lot Help from another person bathing (including washing, rinsing, drying)?: A Lot Help from another person to put on and taking off regular upper body clothing?: A Lot Help from another person to put on and taking off regular lower body clothing?: A Lot 6 Click Score: 14   End of Session    Activity Tolerance: Patient limited by fatigue Patient left: in bed;with call bell/phone within reach;with family/visitor present  OT Visit Diagnosis: Muscle weakness (generalized) (M62.81);History of falling (Z91.81)                Time: 1030-1057 OT Time Calculation (min): 27 min Charges:  OT General Charges $OT Visit: 1 Visit OT Evaluation $OT Eval Low Complexity: 1 Low  Harrel Carina, MS, OTR/L  Harrel Carina 05/18/2020, 12:49 PM

## 2020-05-18 NOTE — Progress Notes (Signed)
Physical Therapy Treatment Patient Details Name: Joanna Aguilar MRN: [id number] DOB: 10-01-1941 Today's Date: 05/18/2020    History of Present Illness Pt. is a 79 y.o. female who was admitted to [facility] for right hip hemiarthroplasty repair of a displaced femoral neck fracture sustained after a fall down her attic steps. PMHx includes: HTN, Dementia, Hypothyroidism, CKD, Hypokalemia, Pt. has posterior hip precautions.    PT Comments    Pt with increased pain this afternoon and is very tired. Therapist returned to work on functional transfers from [place] back to bed but exercises deferred at this time.  Pt requires maxA+1 during PM session to come to standing. However once upright she is actually able to stabilize with only minA+1. Demonstrates some improved weight acceptance to RLE. Pt performs standing pivot transfer from recliner back to bed. Cues provided for safe sequencing with transfers and practiced some standing weight shifting. She requires maxA+1 this afternoon to go from sitting at EOB to supine once back to bed. Pt is still too unsafe to truly ambulate at this time. Pt will benefit from PT services to address deficits in strength, balance, and mobility in order to return to full function at home.    Follow Up Recommendations  SNF     Equipment Recommendations  Rolling walker with 5" wheels;Other (comment) (TBD at next venue)    Recommendations for Other Services       Precautions / Restrictions Precautions Precautions: Posterior Hip Restrictions Weight Bearing Restrictions: Yes RLE Weight Bearing: Weight bearing as tolerated    Mobility  Bed Mobility Overal bed mobility: Needs Assistance Bed Mobility: Sit to Supine     Supine to sit: Mod assist Sit to supine: Max assist   General bed mobility comments: Pt more fatigued this afternoon and required maxA+1 one at edge of bed to get to supine. Pt grimaces with movement of RLE  Transfers Overall transfer level: Needs  assistance Equipment used: Rolling walker (2 wheeled) Transfers: Sit to/from Stand Sit to Stand: Max assist         General transfer comment: Pt requires maxA+1 during PM session to come to standing. However once upright she is actually able to stabilize with only minA+1. Demonstrates some improved weight acceptance to RLE. Pt performs standing pivot transfer from recliner back to bed. She is unsafe to truly ambulate at this time.   Ambulation/Gait                 Stairs             Wheelchair Mobility    Modified Rankin (Stroke Patients Only)       Balance Overall balance assessment: Needs assistance Sitting-balance support: No upper extremity supported Sitting balance-Leahy Scale: Fair     Standing balance support: Bilateral upper extremity supported Standing balance-Leahy Scale: Poor Standing balance comment: Pt requires at least minA+1 to maintain standing balance                            Cognition Arousal/Alertness: Lethargic Behavior During Therapy: WFL for tasks assessed/performed Overall Cognitive Status: History of cognitive impairments - at baseline                                 General Comments: Pt is AOx2 which is roughly close to her baseline      Exercises Total Joint Exercises Ankle Circles/Pumps: AROM;Both;10 reps [employer]  Sets: Strengthening;Both;10 reps Gluteal Sets: Strengthening;Both;10 reps Towel Squeeze: Strengthening;Both;10 reps Short Arc Quad: Strengthening;Right;10 reps Heel Slides: Strengthening;Right;10 reps Hip ABduction/ADduction: Strengthening;Right;10 reps Straight Leg Raises: Strengthening;Right;10 reps    General Comments        Pertinent Vitals/Pain Pain Assessment: Faces Faces Pain Scale: Hurts whole lot Pain Location: R hip with functional mobility Pain Descriptors / Indicators: Grimacing Pain Intervention(s): Monitored during session    Home Living Family/patient expects to be  discharged to:: Private residence Living Arrangements: Children Available Help at Discharge: Family Type of Home: House Home Access: Stairs to enter Entrance Stairs-Rails: Left Home Layout: One level Home Equipment: None      Prior Function Level of Independence: Needs assistance  Gait / Transfers Assistance Needed: Pt. ambulated without an assistive device around the house and outside. She reports 2 falls in the last 12 months ADL's / Homemaking Assistance Needed: Pt. was independent with ADLs, light homemaking, gardening, maintains her flower beds, and mowing. Pt. family assists with cooking, and medication management. Pt. does not drive.     PT Goals (current goals can now be found in the care plan section) Acute Rehab PT Goals Patient Stated Goal: To regain independence PT Goal Formulation: With patient/family Time For Goal Achievement: 06/01/20 Potential to Achieve Goals: Fair Progress towards PT goals: Progressing toward goals    Frequency    BID      PT Plan Current plan remains appropriate    Co-evaluation              AM-PAC PT "6 Clicks" Mobility   Outcome Measure  Help needed turning from your back to your side while in a flat bed without using bedrails?: A Lot Help needed moving from lying on your back to sitting on the side of a flat bed without using bedrails?: A Lot Help needed moving to and from a bed to a chair (including a wheelchair)?: A Lot Help needed standing up from a chair using your arms (e.g., wheelchair or bedside chair)?: A Lot Help needed to walk in hospital room?: Total Help needed climbing 3-5 steps with a railing? : Total 6 Click Score: 10    End of Session Equipment Utilized During Treatment: Gait belt Activity Tolerance: Patient tolerated treatment well Patient left: with family/visitor present;in bed;with bed alarm set Nurse Communication: Mobility status PT Visit Diagnosis: Unsteadiness on feet (R26.81);Muscle weakness  (generalized) (M62.81);Difficulty in walking, not elsewhere classified (R26.2);Pain Pain - Right/Left: Right Pain - part of body: Hip     Time: 1350-1403 PT Time Calculation (min) (ACUTE ONLY): 13 min  Charges:  $Therapeutic Exercise: 8-22 mins $Therapeutic Activity: 8-22 mins                     Lyndel Safe Lyrik Dockstader PT, DPT, GCS    Fayette Gasner 05/18/2020, 3:45 PM

## 2020-05-18 NOTE — Progress Notes (Addendum)
PROGRESS NOTE    Joanna Aguilar  YHC:[id number]  DOB: 06-18-1941  DOA: 05/16/2020 PCP: Lavera Guise, MD Outpatient Specialists:   Hospital course:  Patient is a 79 year old female with dementia, HTN, hypothyroidism and CKD 3 who was admitted last night with right hip fracture after mechanical fall.   Subjective:  Patient was seen in conjunction with her daughter today.  Her daughter notes that at baseline her mother is very active and likes to 62 to her garden even though she has significant dementia.  She feels her mother is doing somewhat better today than yesterday.  Patient was participating with OT when I went to see her.  Patient herself denies any shortness of breath or chest pain or belly pain.  She feels her pain is well controlled.  Note she has not had a bowel movement for 3 days.  Objective: Vitals:   05/17/20 2337 05/18/20 0431 05/18/20 0801 05/18/20 1528  BP: 109/82 125/90 124/84 131/83  Pulse: 88 85 88 76  Resp: 16 17 17 17   Temp: (!) 97.5 F (36.4 C) 97.7 F (36.5 C) 98.2 F (36.8 C)   TempSrc: Oral Oral Axillary   SpO2: 100% 98% 98% 97%  Weight:      Height:        Intake/Output Summary (Last 24 hours) at 05/18/2020 1705 Last data filed at 05/18/2020 1253 Gross per 24 hour  Intake 340 ml  Output 450 ml  Net -110 ml   Filed Weights   05/16/20 1202  Weight: 36.3 kg     Exam:  General: Frail elderly female sitting up working actively with occupational therapy.  Eyes: sclera anicteric, conjuctiva mild injection bilaterally CVS: S1-S2, regular  Respiratory:  decreased air entry bilaterally secondary to decreased inspiratory effort, rales at bases  GI: NABS, soft, NT  LE: No edema.    Assessment & Plan:   Closed displaced fracture of right femoral neck Status post right hip hemiarthroplasty earlier today, patient tolerated without immediate complication. DVT prophylaxis and pain management per orthopedics protocol. PT OT per orthopedics  protocol  Constipation We will add MiraLAX daily  Anemia Likely acute blood loss anemia status post hemiarthroplasty. Patient is hemodynamically stable. Repeat CBC in a.m.  Hypokalemia: Repleted and normalized this morning  Essential hypertension Continue home doses of amlodipine  Dementia without behavioral disturbance (HCC) Continue home doses of donepezil and Namenda  Postablative hypothyroidism Continue Synthroid  CKD stage IIIa Kidney function appears to be at baseline Avoid renal toxic medication and or dehydration   DVT prophylaxis: Enoxaparin Code Status: DNR Family Communication: Dr. Mack Guise discussed patient's care with her daughter earlier today Disposition Plan:   Patient is from: Home  Anticipated Discharge Location: SNF  Barriers to Discharge: Status post hemiarthroplasty earlier today  Is patient medically stable for Discharge: No   Consultants:  Orthopedic  Procedures:  Status post right hemiarthroplasty of hip  Antimicrobials:  None   Data Reviewed:  Basic Metabolic Panel: Recent Labs  Lab 05/16/20 1130 05/17/20 0600 05/18/20 0404  NA 139 137 135  K 3.0* 3.8 3.9  CL 101 98 100  CO2 25 27 27   GLUCOSE 144* 140* 114*  BUN 21 21 33*  CREATININE 1.15* 1.10* 1.53*  CALCIUM 9.1 9.5 8.9  MG  --  1.9  --    Liver Function Tests: Recent Labs  Lab 05/16/20 1130  AST 25  ALT 14  ALKPHOS 58  BILITOT 0.9  PROT 7.7  ALBUMIN 4.0  No results for input(s): LIPASE, AMYLASE in the last 168 hours. No results for input(s): AMMONIA in the last 168 hours. CBC: Recent Labs  Lab 05/16/20 1130 05/17/20 0600 05/18/20 0404  WBC 6.7 7.6 8.4  NEUTROABS 5.4  --   --   HGB 10.7* 11.8* 9.9*  HCT 31.9* 34.4* 29.9*  MCV 80.2 78.5* 79.1*  PLT 162 169 159   Cardiac Enzymes: No results for input(s): CKTOTAL, CKMB, CKMBINDEX, TROPONINI in the last 168 hours. BNP (last 3 results) No results for input(s): PROBNP in the last 8760  hours. CBG: No results for input(s): GLUCAP in the last 168 hours.  Recent Results (from the past 240 hour(s))  SARS Coronavirus 2 by RT PCR (hospital order, performed in [facility] hospital lab) Nasopharyngeal Nasopharyngeal Swab     Status: None   Collection Time: 05/16/20 11:30 AM   Specimen: Nasopharyngeal Swab  Result Value Ref Range Status   SARS Coronavirus 2 NEGATIVE NEGATIVE Final    Comment: (NOTE) SARS-CoV-2 target nucleic acids are NOT DETECTED.  The SARS-CoV-2 RNA is generally detectable in upper and lower respiratory specimens during the acute phase of infection. The lowest concentration of SARS-CoV-2 viral copies this assay can detect is 250 copies / mL. A negative result does not preclude SARS-CoV-2 infection and should not be used as the sole basis for treatment or other patient management decisions.  A negative result may occur with improper specimen collection / handling, submission of specimen other than nasopharyngeal swab, presence of viral mutation(s) within the areas targeted by this assay, and inadequate number of viral copies (<250 copies / mL). A negative result must be combined with clinical observations, patient history, and epidemiological information.  Fact Sheet for Patients:   StrictlyIdeas.no  Fact Sheet for Healthcare Providers: BankingDealers.co.za  This test is not yet approved or  cleared by the [place] FDA and has been authorized for detection and/or diagnosis of SARS-CoV-2 by FDA under an Emergency Use Authorization (EUA).  This EUA will remain in effect (meaning this test can be used) for the duration of the COVID-19 declaration under Section 564(b)(1) of the Act, 21 U.S.C. section 360bbb-3(b)(1), unless the authorization is terminated or revoked sooner.  Performed at [facility], [address], [place], [place] [address]   Surgical PCR screen     Status: Abnormal    Collection Time: 05/16/20 11:34 PM   Specimen: Nasal Mucosa; Nasal Swab  Result Value Ref Range Status   MRSA, PCR NEGATIVE NEGATIVE Final   Staphylococcus aureus POSITIVE (A) NEGATIVE Final    Comment: (NOTE) The Xpert SA Assay (FDA approved for NASAL specimens in patients 23 years of age and older), is one component of a comprehensive surveillance program. It is not intended to diagnose infection nor to guide or monitor treatment. Performed at [facility], [address] [place], [place] [address]       Studies: DG Hip Port Unilat With Pelvis 1V Right  Result Date: 05/17/2020 CLINICAL DATA:  Hemiarthroplasty EXAM: DG HIP (WITH OR WITHOUT PELVIS) 1V PORT RIGHT COMPARISON:  05/16/2020 FINDINGS: Interval right hip hemiarthroplasty with normal alignment. Gas in the soft tissues consistent with recent surgery. IMPRESSION: Interval right hip hemiarthroplasty with expected postsurgical change. Electronically Signed   By: Donavan Foil M.D.   On: 05/17/2020 16:27     Scheduled Meds: . amLODipine  5 mg Oral Daily  . Chlorhexidine Gluconate Cloth  6 each Topical Q0600  . docusate sodium  100 mg Oral  BID  . donepezil  10 mg Oral Daily  . enoxaparin (LOVENOX) injection  30 mg Subcutaneous Q24H  . feeding supplement (ENSURE ENLIVE)  237 mL Oral Q24H  . levothyroxine  88 mcg Oral Daily  . memantine  5 mg Oral Daily  . multivitamin with minerals  1 tablet Oral Daily  . mupirocin ointment  1 application Nasal BID  . nutrition supplement (JUVEN)  1 packet Oral BID BM  . polyethylene glycol  17 g Oral Daily  . senna  1 tablet Oral BID  . traMADol  50 mg Oral Q6H   Continuous Infusions: . sodium chloride 75 mL/hr at 05/18/20 4970    Principal Problem:   Closed displaced fracture of right femoral neck (HCC) Active Problems:   Essential hypertension   Dementia without behavioral disturbance ([place])   Postablative hypothyroidism   Fall at home, initial encounter   Hypokalemia    CKD (chronic kidney disease), stage IIIa     Kage Willmann Tublu Jamiyah Dingley, [facility]  If 7PM-7AM, please contact night-coverage [email or web address] Password [email or web address] 05/18/2020, 5:05 PM    LOS: 2 days

## 2020-05-19 LAB — BASIC METABOLIC PANEL
Anion gap: 7 (ref 5–15)
BUN: 42 mg/dL — ABNORMAL HIGH (ref 8–23)
CO2: 28 mmol/L (ref 22–32)
Calcium: 8.6 mg/dL — ABNORMAL LOW (ref 8.9–10.3)
Chloride: 105 mmol/L (ref 98–111)
Creatinine, Ser: 1.32 mg/dL — ABNORMAL HIGH (ref 0.44–1.00)
GFR calc Af Amer: 45 mL/min — ABNORMAL LOW (ref >60)
GFR calc non Af Amer: 39 mL/min — ABNORMAL LOW (ref >60)
Glucose, Bld: 84 mg/dL (ref 70–99)
Potassium: 4.1 mmol/L (ref 3.5–5.1)
Sodium: 140 mmol/L (ref 135–145)

## 2020-05-19 LAB — URINALYSIS, ROUTINE W REFLEX MICROSCOPIC
Bacteria, UA: NONE SEEN
Bilirubin Urine: NEGATIVE
Glucose, UA: NEGATIVE mg/dL
Ketones, ur: 5 mg/dL — AB
Leukocytes,Ua: NEGATIVE
Nitrite: NEGATIVE
Protein, ur: NEGATIVE mg/dL
Specific Gravity, Urine: 1.01 (ref 1.005–1.030)
pH: 5 (ref 5.0–8.0)

## 2020-05-19 LAB — CBC
HCT: 28 % — ABNORMAL LOW (ref 36.0–46.0)
Hemoglobin: 9.1 g/dL — ABNORMAL LOW (ref 12.0–15.0)
MCH: 26.4 pg (ref 26.0–34.0)
MCHC: 32.5 g/dL (ref 30.0–36.0)
MCV: 81.2 fL (ref 80.0–100.0)
Platelets: 150 10*3/uL (ref 150–400)
RBC: 3.45 MIL/uL — ABNORMAL LOW (ref 3.87–5.11)
RDW: 14.6 % (ref 11.5–15.5)
WBC: 9.4 10*3/uL (ref 4.0–10.5)
nRBC: 0 % (ref 0.0–0.2)

## 2020-05-19 NOTE — TOC Initial Note (Signed)
Transition of Care [facility]) - Initial/Assessment Note    Patient Details  Name: Joanna Aguilar MRN: [id number] Date of Birth: 16-Feb-1941  [facility] CM/SW Contact:    Boris Sharper, LCSW Phone Number: 05/19/2020, 11:09 AM  Clinical Narrative:                 CSW contacted pt's daughter Katrina due to pt's fluctuating orientation. Prior to hospitalization pt lives with her daughter who cares for her.  CSW explained the PT recommendations to the family, and family was agreeable to SNF placement. Family prefers [facility] but are open to other surrounding facilities.CSW completed FL2 and PASRR and faxed to surrounding facilities.   TOC will continue to follow.  Expected Discharge Plan: [place] Barriers to Discharge: No SNF bed, Continued Medical Work up   Patient Goals and CMS Choice Patient states their goals for this hospitalization and ongoing recovery are:: Family wants pt to come back home CMS Medicare.gov Compare Post Acute Care list provided to:: Patient Represenative (must comment) (Daughter) Choice offered to / list presented to : Adult Children  Expected Discharge Plan and Services Expected Discharge Plan: [place] Acute Care Choice: [place] Living arrangements for the past 2 months: Single Family Home                                      Prior Living Arrangements/Services Living arrangements for the past 2 months: Single Family Home Lives with:: Adult Children Patient language and need for interpreter reviewed:: Yes        Need for Family Participation in Patient Care: Yes (Comment) (disoriented) Care giver support system in place?: Yes (comment) (daughter)   Criminal Activity/Legal Involvement Pertinent to Current Situation/Hospitalization: No - Comment as needed  Activities of Daily Living Home Assistive Devices/Equipment: None ADL Screening (condition at time of  admission) Patient's cognitive ability adequate to safely complete daily activities?: No Is the patient deaf or have difficulty hearing?: No Does the patient have difficulty seeing, even when wearing glasses/contacts?: No Does the patient have difficulty concentrating, remembering, or making decisions?: No Patient able to express need for assistance with ADLs?: Yes Does the patient have difficulty dressing or bathing?: Yes Independently performs ADLs?: No Communication: Independent Grooming: Needs assistance Is this a change from baseline?: Pre-admission baseline Feeding: Independent Bathing: Needs assistance Is this a change from baseline?: Pre-admission baseline Toileting: Needs assistance Is this a change from baseline?: Pre-admission baseline In/Out Bed: Independent Walks in Home: Independent Does the patient have difficulty walking or climbing stairs?: Yes Weakness of Legs: Both Weakness of Arms/Hands: Both  Permission Sought/Granted Permission sought to share information with : Facility Art therapist granted to share information with : Yes, Verbal Permission Granted  Share Information with NAME: Katrina     Permission granted to share info w Relationship: daughter  Permission granted to share info w Contact Information: [phone]  Emotional Assessment Appearance:: Other (Comment Required (unable to assess) Attitude/Demeanor/Rapport: Unable to Assess Affect (typically observed): Unable to Assess Orientation: : Oriented to Self, Oriented to Place   Psych Involvement: No (comment)  Admission diagnosis:  Closed fracture of neck of right femur, initial encounter ([place]) [S72.001A] Fall, initial encounter [W19.XXXA] Closed displaced fracture of right femoral neck ([place]) [S72.001A] Patient Active Problem List   Diagnosis Date Noted  . Fall at home, initial  encounter 05/16/2020  . Closed displaced fracture of right femoral neck ([place]) 05/16/2020  .  Hypokalemia 05/16/2020  . CKD (chronic kidney disease), stage IIIa 05/16/2020  . Encounter for general adult medical examination with abnormal findings 03/12/2020  . Aortic arch atherosclerosis ([place]) 03/12/2020  . Vitamin D deficiency 03/12/2020  . Dysuria 03/12/2020  . Generalized abdominal pain 02/10/2020  . 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 Acute upper respiratory infection 05/12/2018  . Cough 05/12/2018  . Thyroid disease 02/23/2018  . Essential hypertension 02/23/2018  . Dementia without behavioral disturbance ([place]) 02/23/2018  . B12 deficiency 12/06/2017   PCP:  Lavera Guise, MD Pharmacy:   [id number] [address], [place] [address] Phone: [phone] Fax: [phone]     Social Determinants of Health (SDOH) Interventions    Readmission Risk Interventions No flowsheet data found.

## 2020-05-19 NOTE — NC FL2 (Signed)
[place] LEVEL OF CARE SCREENING TOOL     IDENTIFICATION  Patient Name: Joanna Aguilar Birthdate: May 16, 1941 Sex: female Admission Date (Current Location): 05/16/2020  [place] and [place] Number:  Engineering geologist and Address:  [facility], [address], [place], [place] [address]      Provider Number: [id number]  Attending Physician Name and Address:  Oren Binet*  Relative Name and Phone Number:  Adonis Huguenin [phone]    Current Level of Care: Hospital Recommended Level of Care: [place] Prior Approval Number:    Date Approved/Denied:   PASRR Number: [id number]  Discharge Plan: SNF    Current Diagnoses: Patient Active Problem List   Diagnosis Date Noted  . Fall at home, initial encounter 05/16/2020  . Closed displaced fracture of right femoral neck ([place]) 05/16/2020  . Hypokalemia 05/16/2020  . CKD (chronic kidney disease), stage IIIa 05/16/2020  . Encounter for general adult medical examination with abnormal findings 03/12/2020  . Aortic arch atherosclerosis ([place]) 03/12/2020  . Vitamin D deficiency 03/12/2020  . Dysuria 03/12/2020  . Generalized abdominal pain 02/10/2020  . 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 Acute upper respiratory infection 05/12/2018  . Cough 05/12/2018  . Thyroid disease 02/23/2018  . Essential hypertension 02/23/2018  . Dementia without behavioral disturbance ([place]) 02/23/2018  . B12 deficiency 12/06/2017    Orientation RESPIRATION BLADDER Height & Weight     Self, Place  Normal Incontinent Weight: 80 lb (36.3 kg) Height:  5\' 2"  (157.5 cm)  BEHAVIORAL SYMPTOMS/MOOD NEUROLOGICAL BOWEL NUTRITION STATUS      Continent Diet  AMBULATORY STATUS COMMUNICATION OF NEEDS Skin   Limited Assist Verbally Surgical wounds (Right hip hemiarthroplasty)                        Personal Care Assistance Level of Assistance  Bathing, Feeding, Dressing Bathing Assistance: Limited assistance Feeding assistance: Limited assistance Dressing Assistance: Maximum assistance     Functional Limitations Info  Sight, Hearing, Speech Sight Info: Adequate Hearing Info: Adequate Speech Info: Adequate    SPECIAL CARE FACTORS FREQUENCY  PT (By licensed PT), OT (By licensed OT)     PT Frequency: 5x week OT Frequency: 5x week            Contractures Contractures Info: Not present    Additional Factors Info  Code Status Code Status Info: DNR             Current Medications (05/19/2020):  This is the current hospital active medication list Current Facility-Administered Medications  Medication Dose Route Frequency Provider Last Rate Last Admin  . 0.9 %  sodium chloride infusion   Intravenous Continuous Thornton Park, MD 75 mL/hr at 05/18/20 1852 New Bag at 05/18/20 1852  . acetaminophen (TYLENOL) tablet 650 mg  650 mg Oral Q6H PRN Thornton Park, MD   650 mg at 05/18/20 0901  . alum & mag hydroxide-simeth (MAALOX/MYLANTA) 200-200-20 MG/5ML suspension 30 mL  30 mL Oral Q4H PRN Thornton Park, MD      . amLODipine (NORVASC) tablet 5 mg  5 mg Oral Daily Thornton Park, MD   5 mg at 05/19/20 0856  . bisacodyl (DULCOLAX) suppository 10 mg  10 mg Rectal Daily PRN Thornton Park, MD   10 mg at 05/19/20 1019  . Chlorhexidine Gluconate Cloth 2 % PADS 6 each  6 each Topical Q0600 Krasinski,  Lennette Bihari, MD   6 each at 05/18/20 0515  . docusate sodium (COLACE) capsule 100 mg  100 mg Oral BID Thornton Park, MD   100 mg at 05/19/20 0856  . donepezil (ARICEPT) tablet 10 mg  10 mg Oral Daily Thornton Park, MD   10 mg at 05/19/20 0856  . enoxaparin (LOVENOX) injection 30 mg  30 mg Subcutaneous Q24H Thornton Park, MD   30 mg at 05/19/20 0856  . feeding supplement (ENSURE ENLIVE) (ENSURE ENLIVE) liquid 237 mL  237 mL Oral Q24H Thornton Park, MD   237 mL at  05/17/20 2307  . hydrALAZINE (APRESOLINE) injection 5 mg  5 mg Intravenous Q2H PRN Thornton Park, MD   5 mg at 05/17/20 0909  . HYDROcodone-acetaminophen (NORCO) 7.5-325 MG per tablet 1-2 tablet  1-2 tablet Oral Q4H PRN Thornton Park, MD      . HYDROcodone-acetaminophen (NORCO/VICODIN) 5-325 MG per tablet 1-2 tablet  1-2 tablet Oral Q4H PRN Thornton Park, MD   1 tablet at 05/19/20 0854  . levothyroxine (SYNTHROID) tablet 88 mcg  88 mcg Oral Daily Thornton Park, MD   88 mcg at 05/19/20 0617  . magnesium citrate solution 1 Bottle  1 Bottle Oral Once PRN Thornton Park, MD      . memantine [facility]) tablet 5 mg  5 mg Oral Daily Thornton Park, MD   5 mg at 05/19/20 0855  . methocarbamol (ROBAXIN) tablet 500 mg  500 mg Oral Q8H PRN Thornton Park, MD   500 mg at 05/16/20 2030  . morphine 2 MG/ML injection 0.5-1 mg  0.5-1 mg Intravenous Q2H PRN Thornton Park, MD      . multivitamin with minerals tablet 1 tablet  1 tablet Oral Daily Thornton Park, MD   1 tablet at 05/18/20 0854  . mupirocin ointment (BACTROBAN) 2 % 1 application  1 application Nasal BID Thornton Park, MD   1 application at 76/81/15 0041  . nutrition supplement (JUVEN) (JUVEN) powder packet 1 packet  1 packet Oral BID BM Thornton Park, MD   1 packet at 05/19/20 1040  . ondansetron (ZOFRAN) tablet 4 mg  4 mg Oral Q6H PRN Thornton Park, MD       Or  . ondansetron [facility]) injection 4 mg  4 mg Intravenous Q6H PRN Thornton Park, MD      . polyethylene glycol (MIRALAX / GLYCOLAX) packet 17 g  17 g Oral Daily Bonnell Public Tublu, MD   17 g at 05/19/20 0856  . senna (SENOKOT) tablet 8.6 mg  1 tablet Oral BID Thornton Park, MD   8.6 mg at 05/19/20 0856  . senna-docusate (Senokot-S) tablet 1 tablet  1 tablet Oral QHS PRN Thornton Park, MD   1 tablet at 05/16/20 2030  . traMADol (ULTRAM) tablet 50 mg  50 mg Oral Q6H Thornton Park, MD   50 mg at 05/19/20 7262     Discharge Medications: Please  see discharge summary for a list of discharge medications.  Relevant Imaging Results:  Relevant Lab Results:   Additional Information [phone]  [place], [place]

## 2020-05-19 NOTE — Progress Notes (Signed)
PROGRESS NOTE    Joanna Aguilar  FYB:[id number]  DOB: 1941-06-29  DOA: 05/16/2020 PCP: Lavera Guise, MD Outpatient Specialists:   Hospital course:  Patient is a 79 year old female with dementia, HTN, hypothyroidism and CKD 3 who was admitted last night with right hip fracture after mechanical fall.   Subjective:  Patient was not very communicative this morning.  She was holding her belly.  When I asked her if she had belly pain she nodded yes.  She otherwise was not very talkative.  Objective: Vitals:   05/18/20 2358 05/19/20 0420 05/19/20 0834 05/19/20 1100  BP: 136/86 (!) 147/86 (!) 179/111 (!) 135/96  Pulse: 79 78 65 98  Resp: 18 18 18    Temp: 97.9 F (36.6 C) 97.9 F (36.6 C) 98.5 F (36.9 C)   TempSrc: Oral Oral Oral   SpO2: 98% 99% 98%   Weight:      Height:        Intake/Output Summary (Last 24 hours) at 05/19/2020 1304 Last data filed at 05/19/2020 1030 Gross per 24 hour  Intake 240 ml  Output 1100 ml  Net -860 ml   Filed Weights   05/16/20 1202  Weight: 36.3 kg     Exam:  General: Frail elderly female lying flat in bed holding her belly. Eyes: sclera anicteric, conjuctiva mild injection bilaterally CVS: S1-S2, regular  Respiratory:  decreased air entry bilaterally secondary to decreased inspiratory effort, rales at bases  GI: Abdomen has normoactive bowel sounds and is soft.  She does have firmness in suprapubic area that is tender. LE: No edema.    Assessment & Plan:   Urinary retention Patient noted to have more than 900 cc by bladder scan and patient seems quite uncomfortable. We will place Foley catheter Urine for UA and culture to rule out UTI as possible cause Patient is also constipated and this may be contributory as well  Closed displaced fracture of right femoral neck Status post right hip hemiarthroplasty earlier today, patient tolerated without immediate complication. DVT prophylaxis and pain management per orthopedics  protocol. PT OT per orthopedics protocol  Constipation Continue MiraLAX daily Add senna and as needed Dulcolax suppository which may be quite helpful today. Discussed with nurses.  Anemia Hemoglobin continues to drift down mildly, this is most likely secondary to acute blood loss anemia status post hemiarthroplasty. Patient is hemodynamically stable. Continue to follow CBC.Marland Kitchen  Hypokalemia: Repleted and normalized this morning  Essential hypertension Continue home doses of amlodipine  Dementia without behavioral disturbance (HCC) Continue home doses of donepezil and Namenda  Postablative hypothyroidism Continue Synthroid  CKD stage IIIa Kidney function appears to be at baseline Avoid renal toxic medication and or dehydration   DVT prophylaxis: Enoxaparin Code Status: DNR Family Communication: Patient's daughter has been very attentive and is at bedside frequently Disposition Plan:   Patient is from: Home  Anticipated Discharge Location: SNF  Barriers to Discharge: Urinary retention,   Is patient medically stable for Discharge: No   Consultants:  Orthopedic  Procedures:  Status post right hemiarthroplasty of hip  Antimicrobials:  None   Data Reviewed:  Basic Metabolic Panel: Recent Labs  Lab 05/16/20 1130 05/17/20 0600 05/18/20 0404 05/19/20 0440  NA 139 137 135 140  K 3.0* 3.8 3.9 4.1  CL 101 98 100 105  CO2 25 27 27 28   GLUCOSE 144* 140* 114* 84  BUN 21 21 33* 42*  CREATININE 1.15* 1.10* 1.53* 1.32*  CALCIUM 9.1 9.5 8.9 8.6*  MG  --  1.9  --   --    Liver Function Tests: Recent Labs  Lab 05/16/20 1130  AST 25  ALT 14  ALKPHOS 58  BILITOT 0.9  PROT 7.7  ALBUMIN 4.0   No results for input(s): LIPASE, AMYLASE in the last 168 hours. No results for input(s): AMMONIA in the last 168 hours. CBC: Recent Labs  Lab 05/16/20 1130 05/17/20 0600 05/18/20 0404 05/19/20 0440  WBC 6.7 7.6 8.4 9.4  NEUTROABS 5.4  --   --   --   HGB 10.7*  11.8* 9.9* 9.1*  HCT 31.9* 34.4* 29.9* 28.0*  MCV 80.2 78.5* 79.1* 81.2  PLT 162 169 159 150   Cardiac Enzymes: No results for input(s): CKTOTAL, CKMB, CKMBINDEX, TROPONINI in the last 168 hours. BNP (last 3 results) No results for input(s): PROBNP in the last 8760 hours. CBG: No results for input(s): GLUCAP in the last 168 hours.  Recent Results (from the past 240 hour(s))  SARS Coronavirus 2 by RT PCR (hospital order, performed in [facility] hospital lab) Nasopharyngeal Nasopharyngeal Swab     Status: None   Collection Time: 05/16/20 11:30 AM   Specimen: Nasopharyngeal Swab  Result Value Ref Range Status   SARS Coronavirus 2 NEGATIVE NEGATIVE Final    Comment: (NOTE) SARS-CoV-2 target nucleic acids are NOT DETECTED.  The SARS-CoV-2 RNA is generally detectable in upper and lower respiratory specimens during the acute phase of infection. The lowest concentration of SARS-CoV-2 viral copies this assay can detect is 250 copies / mL. A negative result does not preclude SARS-CoV-2 infection and should not be used as the sole basis for treatment or other patient management decisions.  A negative result may occur with improper specimen collection / handling, submission of specimen other than nasopharyngeal swab, presence of viral mutation(s) within the areas targeted by this assay, and inadequate number of viral copies (<250 copies / mL). A negative result must be combined with clinical observations, patient history, and epidemiological information.  Fact Sheet for Patients:   StrictlyIdeas.no  Fact Sheet for Healthcare Providers: BankingDealers.co.za  This test is not yet approved or  cleared by the [place] FDA and has been authorized for detection and/or diagnosis of SARS-CoV-2 by FDA under an Emergency Use Authorization (EUA).  This EUA will remain in effect (meaning this test can be used) for the duration of the COVID-19  declaration under Section 564(b)(1) of the Act, 21 U.S.C. section 360bbb-3(b)(1), unless the authorization is terminated or revoked sooner.  Performed at [facility], [address] [place] [address], [place], [place] [address]   Surgical PCR screen     Status: Abnormal   Collection Time: 05/16/20 11:34 PM   Specimen: Nasal Mucosa; Nasal Swab  Result Value Ref Range Status   MRSA, PCR NEGATIVE NEGATIVE Final   Staphylococcus aureus POSITIVE (A) NEGATIVE Final    Comment: (NOTE) The Xpert SA Assay (FDA approved for NASAL specimens in patients 24 years of age and older), is one component of a comprehensive surveillance program. It is not intended to diagnose infection nor to guide or monitor treatment. Performed at [facility], [address] [place],  [address]       Studies: DG Hip Port Unilat With Pelvis 1V Right  Result Date: 05/17/2020 CLINICAL DATA:  Hemiarthroplasty EXAM: DG HIP (WITH OR WITHOUT PELVIS) 1V PORT RIGHT COMPARISON:  05/16/2020 FINDINGS: Interval right hip hemiarthroplasty with normal alignment. Gas in the soft tissues consistent with recent surgery. IMPRESSION: Interval right hip hemiarthroplasty with  expected postsurgical change. Electronically Signed   By: Donavan Foil M.D.   On: 05/17/2020 16:27     Scheduled Meds: . amLODipine  5 mg Oral Daily  . Chlorhexidine Gluconate Cloth  6 each Topical Q0600  . docusate sodium  100 mg Oral BID  . donepezil  10 mg Oral Daily  . enoxaparin (LOVENOX) injection  30 mg Subcutaneous Q24H  . feeding supplement (ENSURE ENLIVE)  237 mL Oral Q24H  . levothyroxine  88 mcg Oral Daily  . memantine  5 mg Oral Daily  . multivitamin with minerals  1 tablet Oral Daily  . mupirocin ointment  1 application Nasal BID  . nutrition supplement (JUVEN)  1 packet Oral BID BM  . polyethylene glycol  17 g Oral Daily  . senna  1 tablet Oral BID  . traMADol  50 mg Oral Q6H   Continuous Infusions: . sodium chloride 75 mL/hr  at 05/18/20 7619    Principal Problem:   Closed displaced fracture of right femoral neck (HCC) Active Problems:   Essential hypertension   Dementia without behavioral disturbance ([place])   Postablative hypothyroidism   Fall at home, initial encounter   Hypokalemia   CKD (chronic kidney disease), stage IIIa     Davonta Stroot Tublu Chyann Ambrocio, [facility]  If 7PM-7AM, please contact night-coverage [email or web address] Password [email or web address] 05/19/2020, 1:04 PM    LOS: 3 days

## 2020-05-19 NOTE — Plan of Care (Signed)
Pt refusing food and drink this shift. Unable to urinate, bladderscan @448ml . Order for straight cath placed, 535ml output. Pt denies pain.  Problem: Education: Goal: Knowledge of General Education information will improve Description: Including pain rating scale, medication(s)/side effects and non-pharmacologic comfort measures Outcome: Progressing   Problem: Safety: Goal: Ability to remain free from injury will improve Outcome: Progressing   Problem: Nutrition: Goal: Adequate nutrition will be maintained Outcome: Not Progressing

## 2020-05-19 NOTE — Progress Notes (Signed)
Physical Therapy Treatment Patient Details Name: Joanna Aguilar MRN: [id number] DOB: 01/31/1941 Today's Date: 05/19/2020    History of Present Illness Pt. is a 79 y.o. female who was admitted to [facility] for right hip hemiarthroplasty repair of a displaced femoral neck fracture sustained after a fall down her attic steps. PMHx includes: HTN, Dementia, Hypothyroidism, CKD, Hypokalemia, Pt. has posterior hip precautions.    PT Comments    Patient is progressing slowly. She was given pain meds prior to PT session but continues to have increased pain in RLE hip which limits LE movement. She was guarded and required increased time/assist for mobility. Patient also more lethargic requiring cues for increased ROM on LE during exercise. Patient was able to take a few steps to bedside chair with therapist assist. She does get confused at times and requires cues for redirection to task. She would benefit from additional skilled PT intervention to improve strength and mobility;    Follow Up Recommendations  SNF     Equipment Recommendations  Rolling walker with 5" wheels;Other (comment) (TBD at next venue)    Recommendations for Other Services       Precautions / Restrictions Precautions Precautions: Posterior Hip Restrictions Weight Bearing Restrictions: No RLE Weight Bearing: Weight bearing as tolerated    Mobility  Bed Mobility Overal bed mobility: Needs Assistance Bed Mobility: Supine to Sit     Supine to sit: Mod assist;HOB elevated     General bed mobility comments: required cues for hand placement and positioning; Required increased time/effort due to right hip pain; x1 rep;  Transfers Overall transfer level: Needs assistance Equipment used: None Transfers: Sit to/from Stand Sit to Stand: Max assist         General transfer comment: Patient required max A +1 for sit<>stand with patient placing hands on therapist shoulders x1 rep;  Ambulation/Gait Ambulation/Gait  assistance: Mod assist Gait Distance (Feet): 3 Feet Assistive device: None   Gait velocity: decreased   General Gait Details: pt took a few steps to bedside chair with hands on therapist shoulder and therapist holding gait belt. She does exhibit step to patter with short step length and decreased weight shift to RLE with antalgic gait pattern;   Stairs             Wheelchair Mobility    Modified Rankin (Stroke Patients Only)       Balance Overall balance assessment: Needs assistance Sitting-balance support: No upper extremity supported;Feet supported Sitting balance-Leahy Scale: Fair     Standing balance support: Bilateral upper extremity supported Standing balance-Leahy Scale: Poor Standing balance comment: Pt requires at least mod A+1 to maintain standing balance                            Cognition Arousal/Alertness: Lethargic Behavior During Therapy: WFL for tasks assessed/performed Overall Cognitive Status: History of cognitive impairments - at baseline                                 General Comments: Pt is AOx2 which is roughly close to her baseline      Exercises Total Joint Exercises Ankle Circles/Pumps: AROM;Both;10 reps Gluteal Sets: Strengthening;Both;10 reps Short Arc Quad: Strengthening;Both;10 reps Hip ABduction/ADduction: AAROM;Both;10 reps Other Exercises Other Exercises: Patient required AAROM with some exercise due to weakness and difficulty initiating movement in LE. She required mod VCs and visual cues to increase  ROM and improve muscle contraction for better strengthening; Patient guarded on RLE due to increased hip pain;    General Comments        Pertinent Vitals/Pain Pain Assessment: No/denies pain Faces Pain Scale: Hurts whole lot Pain Location: no pain at rest; per face scale 8/10 when moving RLE often grimacing and reaching out for RLE; Pain Descriptors / Indicators: Grimacing Pain Intervention(s):  Limited activity within patient's tolerance;Monitored during session;Premedicated before session;Repositioned    Home Living                      Prior Function            PT Goals (current goals can now be found in the care plan section) Acute Rehab PT Goals Patient Stated Goal: To regain independence PT Goal Formulation: With patient/family Time For Goal Achievement: 06/01/20 Potential to Achieve Goals: Fair Progress towards PT goals: Progressing toward goals    Frequency    BID      PT Plan Current plan remains appropriate    Co-evaluation              AM-PAC PT "6 Clicks" Mobility   Outcome Measure  Help needed turning from your back to your side while in a flat bed without using bedrails?: A Lot Help needed moving from lying on your back to sitting on the side of a flat bed without using bedrails?: A Lot Help needed moving to and from a bed to a chair (including a wheelchair)?: A Lot Help needed standing up from a chair using your arms (e.g., wheelchair or bedside chair)?: A Lot Help needed to walk in hospital room?: Total Help needed climbing 3-5 steps with a railing? : Total 6 Click Score: 10    End of Session Equipment Utilized During Treatment: Gait belt Activity Tolerance: Patient tolerated treatment well Patient left: in chair;with family/visitor present;with call bell/phone within reach;with chair alarm set Nurse Communication: Mobility status PT Visit Diagnosis: Unsteadiness on feet (R26.81);Muscle weakness (generalized) (M62.81);Difficulty in walking, not elsewhere classified (R26.2);Pain Pain - Right/Left: Right Pain - part of body: Hip     Time: 1055-1130 PT Time Calculation (min) (ACUTE ONLY): 35 min  Charges:  $Therapeutic Exercise: 8-22 mins $Therapeutic Activity: 8-22 mins                       Berneda Piccininni PT, DPT 05/19/2020, 1:07 PM

## 2020-05-19 NOTE — Anesthesia Postprocedure Evaluation (Signed)
Anesthesia Post Note  Patient: Joanna Aguilar  Procedure(s) Performed: Hemi HIP ARTHROPLASTY (Right Hip)  Patient location during evaluation: Nursing Unit Anesthesia Type: Spinal Level of consciousness: awake and alert and confused Pain management: pain level controlled Vital Signs Assessment: post-procedure vital signs reviewed and stable Respiratory status: spontaneous breathing and respiratory function stable Cardiovascular status: blood pressure returned to baseline and stable Postop Assessment: no headache, no backache, no apparent nausea or vomiting and patient able to bend at knees Anesthetic complications: no   No complications documented.   Last Vitals:  Vitals:   05/18/20 2358 05/19/20 0420  BP: 136/86 (!) 147/86  Pulse: 79 78  Resp: 18 18  Temp: 36.6 C 36.6 C  SpO2: 98% 99%    Last Pain:  Vitals:   05/19/20 0420  TempSrc: Oral  PainSc:                  [name]

## 2020-05-20 LAB — BASIC METABOLIC PANEL
Anion gap: 8 (ref 5–15)
BUN: 45 mg/dL — ABNORMAL HIGH (ref 8–23)
CO2: 27 mmol/L (ref 22–32)
Calcium: 8.8 mg/dL — ABNORMAL LOW (ref 8.9–10.3)
Chloride: 107 mmol/L (ref 98–111)
Creatinine, Ser: 1.12 mg/dL — ABNORMAL HIGH (ref 0.44–1.00)
GFR calc Af Amer: 55 mL/min — ABNORMAL LOW (ref >60)
GFR calc non Af Amer: 48 mL/min — ABNORMAL LOW (ref >60)
Glucose, Bld: 99 mg/dL (ref 70–99)
Potassium: 3.8 mmol/L (ref 3.5–5.1)
Sodium: 142 mmol/L (ref 135–145)

## 2020-05-20 LAB — CBC
HCT: 25.8 % — ABNORMAL LOW (ref 36.0–46.0)
Hemoglobin: 8.4 g/dL — ABNORMAL LOW (ref 12.0–15.0)
MCH: 26.3 pg (ref 26.0–34.0)
MCHC: 32.6 g/dL (ref 30.0–36.0)
MCV: 80.6 fL (ref 80.0–100.0)
Platelets: 187 10*3/uL (ref 150–400)
RBC: 3.2 MIL/uL — ABNORMAL LOW (ref 3.87–5.11)
RDW: 14.6 % (ref 11.5–15.5)
WBC: 8.6 10*3/uL (ref 4.0–10.5)
nRBC: 0 % (ref 0.0–0.2)

## 2020-05-20 MED ORDER — FERROUS SULFATE 325 (65 FE) MG PO TABS
325.0000 mg | ORAL_TABLET | Freq: Two times a day (BID) | ORAL | Status: DC
  Administered 2020-05-20 – 2020-05-22 (×4): 325 mg via ORAL
  Filled 2020-05-20 (×4): qty 1

## 2020-05-20 NOTE — Progress Notes (Signed)
PROGRESS NOTE    Joanna Aguilar  IRC:[id number] DOB: October 07, 1941 DOA: 05/16/2020 PCP: Lavera Guise, MD   Brief Narrative:  Patient is a 79 year old female with dementia, HTN, hypothyroidism and CKD 3 who was admitted with right hip fracture after mechanical fall.  Orthopedic was consulted and she underwent right hemiarthroplasty on 05/17/2020.  Tolerated the procedure well.  She developed some urinary retention postoperatively requiring Foley catheter.  Patient pulled the catheter herself on 05/20/2020 without any significant injury.  Subjective: Per nursing concern she pulled her Foley out with intact balloon, no significant injury.  Patient denies any pain when seen today.  She was self talking and having a little argument with daughter who was not there.  Assessment & Plan:   Principal Problem:   Closed displaced fracture of right femoral neck (HCC) Active Problems:   Essential hypertension   Dementia without behavioral disturbance (HCC)   Postablative hypothyroidism   Fall at home, initial encounter   Hypokalemia   CKD (chronic kidney disease), stage IIIa  Closed displaced fracture of right femoral neck Status post right hip hemiarthroplasty earlier today, patient tolerated without immediate complication. -PT is recommending SNF placement.  Waiting insurance authorization. -Continue DVT prophylaxis and pain management per orthopedic protocol.  Urinary retention.  Patient had urinary retention postoperatively requiring Foley in place.  UA without any pyuria or sign of infection.  Patient self pulled the catheter this morning without any significant injury.  She was incontinent and did pass some urine in bedpan too. -Monitor with bladder scan-we will try avoiding replacing Foley if able to void.  Constipation.  No BM despite getting MiraLAX.  Might be contributory to her urinary retention. -Try Dulcolax suppository and Fleet enema. -Patient will need a scheduled bowel  regimen.  Anemia.  Hemoglobin continue to decrease without any obvious bleeding. Most likely secondary to blood loss during surgery. -Check iron studies. -Start her on iron supplement. -Continue to monitor.  Essential hypertension.  Blood pressure mildly elevated. -Continue home dose of amlodipine. -Continue to monitor.  Dementia without behavioral disturbance (HCC) Continue home doses of donepezil and Namenda  Postablative hypothyroidism Continue Synthroid  CKD stage IIIa Kidney function appears to be at baseline, creatinine at 1.12 today. Avoid renal toxic medication and or dehydration  Objective: Vitals:   05/19/20 1100 05/19/20 1633 05/19/20 2344 05/20/20 0810  BP: (!) 135/96 (!) 145/86 (!) 155/86 (!) 130/98  Pulse: 98 85 (!) 105 95  Resp:  16 16 16   Temp:  98.2 F (36.8 C) 98 F (36.7 C) 98 F (36.7 C)  TempSrc:  Oral Oral Oral  SpO2:  98% 100% 99%  Weight:      Height:        Intake/Output Summary (Last 24 hours) at 05/20/2020 1348 Last data filed at 05/20/2020 1042 Gross per 24 hour  Intake 0 ml  Output 1400 ml  Net -1400 ml   Filed Weights   05/16/20 1202  Weight: 36.3 kg    Examination:  General exam: Frail, emaciated elderly lady, appears calm and comfortable  Respiratory system: Clear to auscultation. Respiratory effort normal. Cardiovascular system: S1 & S2 heard, RRR. No JVD, murmurs, Gastrointestinal system: Soft, nontender, nondistended, bowel sounds positive. Central nervous system: Alert and oriented. No focal neurological deficits. Extremities: No edema, no cyanosis, pulses intact and symmetrical. Psychiatry: Judgement and insight appear impaired.  DVT prophylaxis: Lovenox Code Status: DNR Family Communication: Call daughter with no response. Disposition Plan:  Status is: Inpatient  Remains inpatient  appropriate because:Inpatient level of care appropriate due to severity of illness   Dispo: The patient is from: Home               Anticipated d/c is to: SNF              Anticipated d/c date is: 1 day              Patient currently is not medically stable to d/c.  Awaiting insurance authorization.  No BM since surgery, working on it.  Consultants:   Orthopedic  Procedures:  Antimicrobials:   Data Reviewed: I have personally reviewed following labs and imaging studies  CBC: Recent Labs  Lab 05/16/20 1130 05/17/20 0600 05/18/20 0404 05/19/20 0440 05/20/20 0446  WBC 6.7 7.6 8.4 9.4 8.6  NEUTROABS 5.4  --   --   --   --   HGB 10.7* 11.8* 9.9* 9.1* 8.4*  HCT 31.9* 34.4* 29.9* 28.0* 25.8*  MCV 80.2 78.5* 79.1* 81.2 80.6  PLT 162 169 159 150 662   Basic Metabolic Panel: Recent Labs  Lab 05/16/20 1130 05/17/20 0600 05/18/20 0404 05/19/20 0440 05/20/20 0446  NA 139 137 135 140 142  K 3.0* 3.8 3.9 4.1 3.8  CL 101 98 100 105 107  CO2 25 27 27 28 27   GLUCOSE 144* 140* 114* 84 99  BUN 21 21 33* 42* 45*  CREATININE 1.15* 1.10* 1.53* 1.32* 1.12*  CALCIUM 9.1 9.5 8.9 8.6* 8.8*  MG  --  1.9  --   --   --    GFR: Estimated Creatinine Clearance: 24.5 mL/min (A) (by C-G formula based on SCr of 1.12 mg/dL (H)). Liver Function Tests: Recent Labs  Lab 05/16/20 1130  AST 25  ALT 14  ALKPHOS 58  BILITOT 0.9  PROT 7.7  ALBUMIN 4.0   No results for input(s): LIPASE, AMYLASE in the last 168 hours. No results for input(s): AMMONIA in the last 168 hours. Coagulation Profile: Recent Labs  Lab 05/16/20 1130  INR 0.9   Cardiac Enzymes: No results for input(s): CKTOTAL, CKMB, CKMBINDEX, TROPONINI in the last 168 hours. BNP (last 3 results) No results for input(s): PROBNP in the last 8760 hours. HbA1C: No results for input(s): HGBA1C in the last 72 hours. CBG: No results for input(s): GLUCAP in the last 168 hours. Lipid Profile: No results for input(s): CHOL, HDL, LDLCALC, TRIG, CHOLHDL, LDLDIRECT in the last 72 hours. Thyroid Function Tests: No results for input(s): TSH, T4TOTAL, FREET4, T3FREE,  THYROIDAB in the last 72 hours. Anemia Panel: No results for input(s): VITAMINB12, FOLATE, FERRITIN, TIBC, IRON, RETICCTPCT in the last 72 hours. Sepsis Labs: No results for input(s): PROCALCITON, LATICACIDVEN in the last 168 hours.  Recent Results (from the past 240 hour(s))  SARS Coronavirus 2 by RT PCR (hospital order, performed in [facility] hospital lab) Nasopharyngeal Nasopharyngeal Swab     Status: None   Collection Time: 05/16/20 11:30 AM   Specimen: Nasopharyngeal Swab  Result Value Ref Range Status   SARS Coronavirus 2 NEGATIVE NEGATIVE Final    Comment: (NOTE) SARS-CoV-2 target nucleic acids are NOT DETECTED.  The SARS-CoV-2 RNA is generally detectable in upper and lower respiratory specimens during the acute phase of infection. The lowest concentration of SARS-CoV-2 viral copies this assay can detect is 250 copies / mL. A negative result does not preclude SARS-CoV-2 infection and should not be used as the sole basis for treatment or other patient management decisions.  A negative result may  occur with improper specimen collection / handling, submission of specimen other than nasopharyngeal swab, presence of viral mutation(s) within the areas targeted by this assay, and inadequate number of viral copies (<250 copies / mL). A negative result must be combined with clinical observations, patient history, and epidemiological information.  Fact Sheet for Patients:   StrictlyIdeas.no  Fact Sheet for Healthcare Providers: BankingDealers.co.za  This test is not yet approved or  cleared by the [place] FDA and has been authorized for detection and/or diagnosis of SARS-CoV-2 by FDA under an Emergency Use Authorization (EUA).  This EUA will remain in effect (meaning this test can be used) for the duration of the COVID-19 declaration under Section 564(b)(1) of the Act, 21 U.S.C. section 360bbb-3(b)(1), unless the authorization  is terminated or revoked sooner.  Performed at [facility], [address] [place], [place] [address]   Surgical PCR screen     Status: Abnormal   Collection Time: 05/16/20 11:34 PM   Specimen: Nasal Mucosa; Nasal Swab  Result Value Ref Range Status   MRSA, PCR NEGATIVE NEGATIVE Final   Staphylococcus aureus POSITIVE (A) NEGATIVE Final    Comment: (NOTE) The Xpert SA Assay (FDA approved for NASAL specimens in patients 75 years of age and older), is one component of a comprehensive surveillance program. It is not intended to diagnose infection nor to guide or monitor treatment. Performed at [facility], [address], [place], [place] [address]      Radiology Studies: No results found.  Scheduled Meds: . amLODipine  5 mg Oral Daily  . Chlorhexidine Gluconate Cloth  6 each Topical Q0600  . docusate sodium  100 mg Oral BID  . donepezil  10 mg Oral Daily  . enoxaparin (LOVENOX) injection  30 mg Subcutaneous Q24H  . feeding supplement (ENSURE ENLIVE)  237 mL Oral Q24H  . levothyroxine  88 mcg Oral Daily  . memantine  5 mg Oral Daily  . multivitamin with minerals  1 tablet Oral Daily  . mupirocin ointment  1 application Nasal BID  . nutrition supplement (JUVEN)  1 packet Oral BID BM  . polyethylene glycol  17 g Oral Daily  . senna  1 tablet Oral BID  . traMADol  50 mg Oral Q6H   Continuous Infusions: . sodium chloride 75 mL/hr at 05/19/20 2339     LOS: 4 days   Time spent: 40 minutes.  Lorella Nimrod, MD [facility]  If 7PM-7AM, please contact night-coverage [email or web address]  05/20/2020, 1:48 PM   This record has been created using Systems analyst. Errors have been sought and corrected,but may not always be located. Such creation errors do not reflect on the standard of care.

## 2020-05-20 NOTE — Care Management Important Message (Signed)
Important Message  Patient Details  Name: Joanna Aguilar MRN: [id number] Date of Birth: [birth year]   Medicare Important Message Given:  Yes     [name] 05/20/2020, 11:30 AM

## 2020-05-20 NOTE — Progress Notes (Signed)
Physical Therapy Treatment Patient Details Name: Joanna Aguilar MRN: [id number] DOB: [birth year] Today's Date: 05/20/2020    History of Present Illness Joanna Aguilar is a 79 y.o. female who was admitted to [facility] for right hip hemiarthroplasty repair of a displaced femoral neck fracture sustained after a fall down her attic steps. PMHx includes: HTN, Dementia, Hypothyroidism, CKD, Hypokalemia, Pt. has posterior hip precautions.    PT Comments    Pt awake in bed upon entry, interactive to author's greeting. Pt is fairly verbose in session, constant stream of thought, occasionally appropriate to context, but mostly just colloquial rebuttals on topic of [place], working, and maintain a physically independent life into her adult years. Pt does fairly well with multimodal cues for most simple exercises, but requires cues to attend to task about every 5 reps. Pt assisted into bridge position, but ultimately unable to process author's request. Pt assisted with initiation of bed mobility with significant increase in right posterior hip pain, becomes more anxious and less willing to cooperate. Session ended after pt repositioned in bed, sitting fairly upright, ABDCT wedge in place, heels floating, SCDs in effect. Pt asleep prior to exit.    Follow Up Recommendations  SNF     Equipment Recommendations  Rolling walker with 5" wheels;Other (comment)    Recommendations for Other Services       Precautions / Restrictions Precautions Precautions: Posterior Hip Precaution Booklet Issued: No Restrictions RLE Weight Bearing: Weight bearing as tolerated    Mobility  Bed Mobility Overal bed mobility:  (attempted, not toklerated)                Transfers                    Ambulation/Gait                 Stairs             Wheelchair Mobility    Modified Rankin (Stroke Patients Only)       Balance                                             Cognition Arousal/Alertness: Awake/alert Behavior During Therapy: Anxious;WFL for tasks assessed/performed;Impulsive Overall Cognitive Status: History of cognitive impairments - at baseline                                        Exercises Total Joint Exercises Bridges: AROM;Both;Limitations Bridges Limitations: too abstract to allow for cognitive processing, ultimately does not attempt performance when cued. General Exercises - Lower Extremity Ankle Circles/Pumps: AROM;Both;15 reps;Supine Heel Slides: AAROM;Both;15 reps;Supine Hip ABduction/ADduction: AAROM;Both;15 reps;Supine    General Comments        Pertinent Vitals/Pain Faces Pain Scale: Hurts a little bit Pain Location: RLE, posterior Rt sacral area (well controlled until attempting bed mobility attempted) Pain Descriptors / Indicators: Grimacing;Guarding Pain Intervention(s): Limited activity within patient's tolerance    Home Living                      Prior Function            PT Goals (current goals can now be found in the care plan section) Acute Rehab PT Goals Patient Stated Goal:  To regain independence PT Goal Formulation: With patient/family Time For Goal Achievement: 06/01/20 Potential to Achieve Goals: Fair Progress towards PT goals: Not progressing toward goals - comment    Frequency    BID      PT Plan Current plan remains appropriate    Co-evaluation              AM-PAC PT "6 Clicks" Mobility   Outcome Measure  Help needed turning from your back to your side while in a flat bed without using bedrails?: A Lot Help needed moving from lying on your back to sitting on the side of a flat bed without using bedrails?: A Lot Help needed moving to and from a bed to a chair (including a wheelchair)?: Total Help needed standing up from a chair using your arms (e.g., wheelchair or bedside chair)?: Total Help needed to walk in hospital room?: Total Help  needed climbing 3-5 steps with a railing? : Total 6 Click Score: 8    End of Session Equipment Utilized During Treatment: Gait belt Activity Tolerance: Patient tolerated treatment well;Treatment limited secondary to agitation Patient left: in bed;with call bell/phone within reach;with bed alarm set Nurse Communication: Mobility status PT Visit Diagnosis: Unsteadiness on feet (R26.81);Muscle weakness (generalized) (M62.81);Difficulty in walking, not elsewhere classified (R26.2);Pain Pain - Right/Left: Right Pain - part of body: Hip     Time: 6384-5364 PT Time Calculation (min) (ACUTE ONLY): 20 min  Charges:  $Therapeutic Exercise: 8-22 mins                     3:34 PM, 05/20/20 Etta Grandchild, PT, DPT Physical Therapist - [facility]  [phone] (Joanna Aguilar)     Joanna Aguilar C 05/20/2020, 3:30 PM

## 2020-05-20 NOTE — Plan of Care (Signed)
  Problem: Education: Goal: Knowledge of General Education information will improve Description Including pain rating scale, medication(s)/side effects and non-pharmacologic comfort measures Outcome: Progressing   

## 2020-05-20 NOTE — TOC Progression Note (Signed)
Transition of Care [facility]) - Progression Note    Patient Details  Name: Joanna Aguilar MRN: [id number] Date of Birth: 12/03/1940  [facility] CM/SW [place], RN Phone Number: 05/20/2020, 10:55 AM  Clinical Narrative:     Reviewed bed choices with the patient's daughter at the bedside, Provided with the Medicare Star ratings, She chose [facility], I explained the insurance approval process and theta we are starting the approval process, I notified Claiborne Billings at [facility] of the bed choice and starting of insurance, Faxed Ins auth request to [facility] at [phone]  Expected Discharge Plan: [place] Barriers to Discharge: No SNF bed, Continued Medical Work up  Expected Discharge Plan and Services Expected Discharge Plan: [place]: [place] arrangements for the past 2 months: Single Family Home                                       Social Determinants of Health (SDOH) Interventions    Readmission Risk Interventions No flowsheet data found.

## 2020-05-20 NOTE — Progress Notes (Signed)
Physical Therapy Treatment Patient Details Name: Joanna Aguilar MRN: [id number] DOB: February 27, 1941 Today's Date: 05/20/2020    History of Present Illness Pt. is a 79 y.o. female who was admitted to [facility] for right hip hemiarthroplasty repair of a displaced femoral neck fracture sustained after a fall down her attic steps. PMHx includes: HTN, Dementia, Hypothyroidism, CKD, Hypokalemia, Pt. has posterior hip precautions.    PT Comments    Confused, thinking she is home and difficult to re-direct this am.  She does state she needs to void and is given bedpan with min a to place.  Voids medium amount (RN stated she pulled catheter out earlier).  AAROM resisted by pt.  RN stated she was recently moved to a low bed.  Attempted to get pt to chair but given agitation and resisting attempts at movement despite +2 assist, mobility is deferred at this time.   Follow Up Recommendations  SNF     Equipment Recommendations       Recommendations for Other Services       Precautions / Restrictions Precautions Precautions: Posterior Hip Restrictions Weight Bearing Restrictions: No RLE Weight Bearing: Weight bearing as tolerated    Mobility  Bed Mobility Overal bed mobility: Needs Assistance             General bed mobility comments: resisting all attempts at movement  Transfers                    Ambulation/Gait                 Stairs             Wheelchair Mobility    Modified Rankin (Stroke Patients Only)       Balance                                            Cognition Arousal/Alertness: Awake/alert Behavior During Therapy: Restless;Anxious Overall Cognitive Status: History of cognitive impairments - at baseline                                 General Comments: confused      Exercises Other Exercises Other Exercises: AAROM RLE - resisting activity, on bed pan to void medium amount of urine    General  Comments        Pertinent Vitals/Pain Pain Assessment: Faces Faces Pain Scale: Hurts even more Pain Descriptors / Indicators: Grimacing;Guarding Pain Intervention(s): Limited activity within patient's tolerance;Monitored during session;Premedicated before session    Home Living                      Prior Function            PT Goals (current goals can now be found in the care plan section) Progress towards PT goals: Not progressing toward goals - comment    Frequency    BID      PT Plan Current plan remains appropriate    Co-evaluation              AM-PAC PT "6 Clicks" Mobility   Outcome Measure  Help needed turning from your back to your side while in a flat bed without using bedrails?: A Lot Help needed moving from lying on your back to sitting on the side  of a flat bed without using bedrails?: Total Help needed moving to and from a bed to a chair (including a wheelchair)?: Total Help needed standing up from a chair using your arms (e.g., wheelchair or bedside chair)?: Total Help needed to walk in hospital room?: Total Help needed climbing 3-5 steps with a railing? : Total 6 Click Score: 7    End of Session   Activity Tolerance: Treatment limited secondary to agitation Patient left: in bed;with call bell/phone within reach;with bed alarm set Nurse Communication: Mobility status Pain - Right/Left: Right Pain - part of body: Hip     Time: 4128-2081 PT Time Calculation (min) (ACUTE ONLY): 19 min  Charges:  $Therapeutic Activity: 8-22 mins                    Chesley Noon, PTA 05/20/20, 9:38 AM

## 2020-05-21 ENCOUNTER — Inpatient Hospital Stay: Payer: Medicare Other

## 2020-05-21 LAB — SARS CORONAVIRUS 2 (TAT 6-24 HRS): SARS Coronavirus 2: NEGATIVE

## 2020-05-21 LAB — CBC
HCT: 27.2 % — ABNORMAL LOW (ref 36.0–46.0)
Hemoglobin: 9.1 g/dL — ABNORMAL LOW (ref 12.0–15.0)
MCH: 26.5 pg (ref 26.0–34.0)
MCHC: 33.5 g/dL (ref 30.0–36.0)
MCV: 79.1 fL — ABNORMAL LOW (ref 80.0–100.0)
Platelets: 225 10*3/uL (ref 150–400)
RBC: 3.44 MIL/uL — ABNORMAL LOW (ref 3.87–5.11)
RDW: 14.6 % (ref 11.5–15.5)
WBC: 7.7 10*3/uL (ref 4.0–10.5)
nRBC: 0 % (ref 0.0–0.2)

## 2020-05-21 LAB — BASIC METABOLIC PANEL
Anion gap: 11 (ref 5–15)
BUN: 39 mg/dL — ABNORMAL HIGH (ref 8–23)
CO2: 30 mmol/L (ref 22–32)
Calcium: 9.4 mg/dL (ref 8.9–10.3)
Chloride: 104 mmol/L (ref 98–111)
Creatinine, Ser: 0.91 mg/dL (ref 0.44–1.00)
GFR calc Af Amer: >60 mL/min (ref >60)
GFR calc non Af Amer: >60 mL/min (ref >60)
Glucose, Bld: 105 mg/dL — ABNORMAL HIGH (ref 70–99)
Potassium: 3.6 mmol/L (ref 3.5–5.1)
Sodium: 145 mmol/L (ref 135–145)

## 2020-05-21 NOTE — Progress Notes (Signed)
Physical Therapy Treatment Patient Details Name: Joanna Aguilar MRN: [id number] DOB: 03/06/1941 Today's Date: 05/21/2020    History of Present Illness Joanna Aguilar is a 79 y.o. female who was admitted to [facility] for right hip hemiarthroplasty repair of a displaced femoral neck fracture sustained after a fall down her attic steps. PMHx includes: HTN, Dementia, Hypothyroidism, CKD, Hypokalemia, Pt. has posterior hip precautions.    PT Comments    Pt was long sitting in bed upon arriving. She is pleasantly confused but agrees to PT session and OOB to eat breakfast. She required mod assist to exit R side of bed with tactile/verbal cues throughout. Pt stood to RW with min assist and ambulated to recliner ~ 5 ft with min assist. Constant redirecting and cues for staying on desired task. Overall tolerated well. Therapist will return later this date and continue to follow current POC. Pt will benefit from continued skilled PT at DC to address strength, balance, and safe functional mobility deficits. At conclusion of session, pt was seated in recliner, chair alarm in place, RN tech in room.     Follow Up Recommendations  SNF     Equipment Recommendations  Other (comment) (defer to next level of care)    Recommendations for Other Services       Precautions / Restrictions Precautions Precautions: Posterior Hip Precaution Booklet Issued: No Restrictions Weight Bearing Restrictions: Yes RLE Weight Bearing: Weight bearing as tolerated    Mobility  Bed Mobility Overal bed mobility: Needs Assistance Bed Mobility: Supine to Sit     Supine to sit: Mod assist;HOB elevated     General bed mobility comments: increased time to perform with vcs/tactile cues throughout. Exited R side of bed.   Transfers Overall transfer level: Needs assistance Equipment used: Rolling walker (2 wheeled) Transfers: Sit to/from Stand Sit to Stand: Min assist         General transfer comment: Min assist to STS  from EOB to [place] for handplacement and adhere to precautions.  Ambulation/Gait Ambulation/Gait assistance: Min assist Gait Distance (Feet): 5 Feet Assistive device: Rolling walker (2 wheeled) Gait Pattern/deviations: Step-to pattern;Antalgic Gait velocity: decreased   General Gait Details: Pt was able to easily ambulate from EOB to recliner ~ 5 steps away with RW. pt was able to progress BLEs with increased difficulty with LLE advancement 2/2 to pain.    Stairs             Wheelchair Mobility    Modified Rankin (Stroke Patients Only)       Balance Overall balance assessment: Needs assistance Sitting-balance support: No upper extremity supported;Feet supported Sitting balance-Leahy Scale: Good Sitting balance - Comments: no LOB seated EOB with supervision only   Standing balance support: Bilateral upper extremity supported;During functional activity Standing balance-Leahy Scale: Fair Standing balance comment: pt has unsteadiness but overall fair balance in standing with dynamic activity                            Cognition Arousal/Alertness: Awake/alert Behavior During Therapy: WFL for tasks assessed/performed Overall Cognitive Status: History of cognitive impairments - at baseline                                 General Comments: Pt was A and O x 2. she has dementia at baseline but was able to follow commands consistently with redirecting/tactile cues. unable to recall  hip precautions.      Exercises Other Exercises Other Exercises: Pt educated re: R hip fx, pain mgmt, importance of mobility for functional strengthening, IS (use) Other Exercises: LBD, face washing, attempted bed mobility, meal setup     General Comments        Pertinent Vitals/Pain Pain Assessment: 0-10 Pain Score: 4  Faces Pain Scale: Hurts a little bit Pain Location: R lateral hip Pain Descriptors / Indicators: Grimacing;Guarding Pain Intervention(s): Limited  activity within patient's tolerance;Monitored during session;Premedicated before session;Repositioned    Home Living                      Prior Function            PT Goals (current goals can now be found in the care plan section) Acute Rehab PT Goals Patient Stated Goal: To regain independence Progress towards PT goals: Progressing toward goals    Frequency    BID      PT Plan Current plan remains appropriate    Co-evaluation              AM-PAC PT "6 Clicks" Mobility   Outcome Measure  Help needed turning from your back to your side while in a flat bed without using bedrails?: A Lot Help needed moving from lying on your back to sitting on the side of a flat bed without using bedrails?: A Lot Help needed moving to and from a bed to a chair (including a wheelchair)?: A Lot Help needed standing up from a chair using your arms (e.g., wheelchair or bedside chair)?: A Lot Help needed to walk in hospital room?: A Lot Help needed climbing 3-5 steps with a railing? : A Lot 6 Click Score: 12    End of Session Equipment Utilized During Treatment: Gait belt Activity Tolerance: Patient tolerated treatment well Patient left: in chair;with call bell/phone within reach;with chair alarm set Research scientist (life sciences) in room) Nurse Communication: Mobility status PT Visit Diagnosis: Unsteadiness on feet (R26.81);Muscle weakness (generalized) (M62.81);Difficulty in walking, not elsewhere classified (R26.2);Pain Pain - Right/Left: Right Pain - part of body: Hip     Time: 1012-1025 PT Time Calculation (min) (ACUTE ONLY): 13 min  Charges:  $Therapeutic Activity: 8-22 mins                     Julaine Fusi PTA 05/21/20, 1:09 PM

## 2020-05-21 NOTE — Progress Notes (Addendum)
Subjective:  POD #4 s/p right hip hemiarthroplasty.   Patient reports right hip pain as moderate.  Patient just returned to bed after being out of bed to a chair.  Patient's daughter is at her bedside.  Daughter states the patient is confused.  Objective:   VITALS:   Vitals:   05/20/20 1609 05/20/20 2042 05/20/20 2345 05/21/20 0759  BP: (!) 143/94 (!) 153/99 (!) 144/90 (!) 156/99  Pulse: (!) 105 99 93 (!) 105  Resp: 16 16 19 16   Temp: 97.6 F (36.4 C) 98.6 F (37 C) 97.8 F (36.6 C) 98 F (36.7 C)  TempSrc: Oral Oral Oral Oral  SpO2: 98% 97% 98% 99%  Weight:      Height:        PHYSICAL EXAM: Right lower extremity: Patient is twisted in bed without her abduction pillow.  Right lower extremity appears internally rotated but not significantly shortened. Dressing has mild serosanguineous drainage. Neurovascular intact Sensation intact distally Intact pulses distally Dorsiflexion/Plantar flexion intact No cellulitis present Compartment soft   LABS  Results for orders placed or performed during the hospital encounter of 05/16/20 (from the past 24 hour(s))  SARS CORONAVIRUS 2 (TAT 6-24 HRS) Nasopharyngeal Nasopharyngeal Swab     Status: None   Collection Time: 05/20/20  5:17 PM   Specimen: Nasopharyngeal Swab  Result Value Ref Range   SARS Coronavirus 2 NEGATIVE NEGATIVE  CBC     Status: Abnormal   Collection Time: 05/21/20  4:29 AM  Result Value Ref Range   WBC 7.7 4.0 - 10.5 K/uL   RBC 3.44 (L) 3.87 - 5.11 MIL/uL   Hemoglobin 9.1 (L) 12.0 - 15.0 g/dL   HCT 27.2 (L) 36 - 46 %   MCV 79.1 (L) 80.0 - 100.0 fL   MCH 26.5 26.0 - 34.0 pg   MCHC 33.5 30.0 - 36.0 g/dL   RDW 14.6 11.5 - 15.5 %   Platelets 225 150 - 400 K/uL   nRBC 0.0 0.0 - 0.2 %  Basic metabolic panel     Status: Abnormal   Collection Time: 05/21/20  4:29 AM  Result Value Ref Range   Sodium 145 135 - 145 mmol/L   Potassium 3.6 3.5 - 5.1 mmol/L   Chloride 104 98 - 111 mmol/L   CO2 30 22 - 32 mmol/L    Glucose, Bld 105 (H) 70 - 99 mg/dL   BUN 39 (H) 8 - 23 mg/dL   Creatinine, Ser 0.91 0.44 - 1.00 mg/dL   Calcium 9.4 8.9 - 10.3 mg/dL   GFR calc non Af Amer >60 >60 mL/min   GFR calc Af Amer >60 >60 mL/min   Anion gap 11 5 - 15    No results found.  Assessment/Plan: 4 Days Post-Op   Principal Problem:   Closed displaced fracture of right femoral neck (HCC) Active Problems:   Essential hypertension   Dementia without behavioral disturbance (HCC)   Postablative hypothyroidism   Fall at home, initial encounter   Hypokalemia   CKD (chronic kidney disease), stage IIIa  Patient is stable from orthopedic standpoint.  I am going to check an x-ray of the right hip to confirm that it remains located given the patient's twisted position in her bed today.  Hip abduction pillow was reapplied.  I reminded nursing staff that this should be on the patient's legs whenever she is in bed.  Even the daughter states the patient tries to cross her legs frequently.  Patient is weightbearing as  tolerated on the right lower extremity but less observe posterior hip precautions.  Continue with physical therapy.  I recommend the patient receive Lovenox 30 mg daily for DVT prophylaxis x2 weeks postop.  Patient should follow-up in my office in 7 to 10 days after discharge from the hospital for reevaluation, staple removal and x-ray.   Addendum: An AP pelvis and lateral of the right hip were taken at the bedside today.  I reviewed these films and the x-rays demonstrate the right hip hemiarthroplasty prosthesis remains located within the acetabulum.  No postop complications are seen.   Thornton Park , MD 05/21/2020, 12:28 PM

## 2020-05-21 NOTE — Progress Notes (Signed)
Physical Therapy Treatment Patient Details Name: Joanna Aguilar MRN: [id number] DOB: [birth year] Today's Date: 05/21/2020    History of Present Illness Joanna Aguilar is a 79 y.o. female who was admitted to [facility] for right hip hemiarthroplasty repair of a displaced femoral neck fracture sustained after a fall down her attic steps. PMHx includes: HTN, Dementia, Hypothyroidism, CKD, Hypokalemia, Pt. has posterior hip precautions.    PT Comments    Pt was long sitting in bed with daughter at bedside. She agrees to PT session with encouragement. Pt has baseline dementia. Daughter at bedside and very helpful throughout. Daughter does not feel she can safely manage patient at home. Pt required mod assist to exit R side of bed with constant vcs and tactile cues for safety and sequencing. Pt unable to recall hip precautions and needs re-enforcement throughout. Once pt was seated EOB, she requested to use BR. Pt stood with min assit and ambulated 20 ft to/from BR. Pt tends to let go of RW at times but overall required only min assist during gait training. Once returned to EOB, Vat team arrived to place new IV. Mitts were reapplied and RN notified of pt's abilities to ambulate to BR with min assist. Overall pt is progressing. Will benefit from continued skilled PT at SNF to address deficits and improve safe functional mobility.     Follow Up Recommendations  SNF     Equipment Recommendations  Other (comment) (defer to next level of care)    Recommendations for Other Services       Precautions / Restrictions Precautions Precautions: Posterior Hip Precaution Booklet Issued: No Precaution Comments: reviewed hip precautions with pt/pt's daughter Restrictions Weight Bearing Restrictions: Yes RLE Weight Bearing: Weight bearing as tolerated    Mobility  Bed Mobility Overal bed mobility: Needs Assistance Bed Mobility: Supine to Sit     Supine to sit: Mod assist;HOB elevated Sit to supine: Max  assist   General bed mobility comments: pt's cognition limits her abilities moreso than physical limitation. Pt is able to SLR and move but need encouragement and initiation of movements  Transfers Overall transfer level: Needs assistance Equipment used: Rolling walker (2 wheeled) Transfers: Sit to/from Stand Sit to Stand: Min assist;From elevated surface         General transfer comment: Pt was able to STS from EOB and from elevated toilet sit with min assist + moderate vcs. Pt needs constant vcs for adhering to hip precautions  Ambulation/Gait Ambulation/Gait assistance: Min assist Gait Distance (Feet): 20 Feet Assistive device: Rolling walker (2 wheeled) Gait Pattern/deviations: Step-to pattern;Antalgic Gait velocity: decreased   General Gait Details: Pt was able to ambulate from R side of bed to BR ~ 20 ft with RW + constant min assist. pt tends to let go og RW and needs max vcs for safety throughout   Stairs             Wheelchair Mobility    Modified Rankin (Stroke Patients Only)       Balance Overall balance assessment: Needs assistance Sitting-balance support: No upper extremity supported;Feet supported Sitting balance-Leahy Scale: Good Sitting balance - Comments: no LOB seated EOB with supervision only   Standing balance support: Bilateral upper extremity supported;During functional activity Standing balance-Leahy Scale: Fair Standing balance comment: pt has unsteadiness but overall fair balance in standing with dynamic activity  Cognition Arousal/Alertness: Awake/alert Behavior During Therapy: WFL for tasks assessed/performed Overall Cognitive Status: History of cognitive impairments - at baseline                                 General Comments: Pt is Alert throughout but is confused. Has dementia at baseline. daughter present and reports her cognition is still different from her normal.       Exercises      General Comments        Pertinent Vitals/Pain Pain Assessment:  (did not rate) Pain Descriptors / Indicators: Grimacing;Guarding Pain Intervention(s): Limited activity within patient's tolerance;Monitored during session;Premedicated before session;Repositioned    Home Living                      Prior Function            PT Goals (current goals can now be found in the care plan section) Acute Rehab PT Goals Patient Stated Goal: To regain independence Progress towards PT goals: Progressing toward goals    Frequency    BID      PT Plan Current plan remains appropriate    Co-evaluation              AM-PAC PT "6 Clicks" Mobility   Outcome Measure  Help needed turning from your back to your side while in a flat bed without using bedrails?: A Lot Help needed moving from lying on your back to sitting on the side of a flat bed without using bedrails?: A Lot Help needed moving to and from a bed to a chair (including a wheelchair)?: A Lot Help needed standing up from a chair using your arms (e.g., wheelchair or bedside chair)?: A Lot Help needed to walk in hospital room?: A Lot Help needed climbing 3-5 steps with a railing? : A Lot 6 Click Score: 12    End of Session Equipment Utilized During Treatment: Gait belt Activity Tolerance: Patient tolerated treatment well Patient left: in bed;with call bell/phone within reach;with bed alarm set;with family/visitor present (VAT team in room at conclusion of session) Nurse Communication: Mobility status PT Visit Diagnosis: Unsteadiness on feet (R26.81);Muscle weakness (generalized) (M62.81);Difficulty in walking, not elsewhere classified (R26.2);Pain Pain - Right/Left: Right Pain - part of body: Hip     Time: 1535-1600 PT Time Calculation (min) (ACUTE ONLY): 25 min  Charges:  $Gait Training: 8-22 mins $Therapeutic Activity: 8-22 mins                     Julaine Fusi PTA 05/21/20, 4:30  PM

## 2020-05-21 NOTE — TOC Progression Note (Signed)
Transition of Care [facility]) - Progression Note    Patient Details  Name: JI FAIRBURN MRN: [id number] Date of Birth: 1940/11/19  Transition of Care [facility]) CM/SW [place], RN Phone Number: 05/21/2020, 9:57 AM  Clinical Narrative:    Checked the status of the insurance auth ref number [id number], it is still pending   Expected Discharge Plan: [place] Barriers to Discharge: No SNF bed, Continued Medical Work up  Expected Discharge Plan and Services Expected Discharge Plan: [place] Choice: [place] arrangements for the past 2 months: Single Family Home                                       Social Determinants of Health (SDOH) Interventions    Readmission Risk Interventions No flowsheet data found.

## 2020-05-21 NOTE — TOC Progression Note (Signed)
Transition of Care [facility]) - Progression Note    Patient Details  Name: Joanna Aguilar MRN: [id number] Date of Birth: [birth year]  Transition of Care [facility]) CM/SW [place], RN Phone Number: 05/21/2020, 1:55 PM  Clinical Narrative:    Check on the insurance approval status, the insurance approval is pending, [employer] states they are behind with the [place] and they were closed yesterday. They will review as soon as they can, this has not been assigned to a coordinator to review yet, I requested them to make a priority for the patient to DC, [employer] said they will do is as soon as possible   Expected Discharge Plan: Skilled Nursing Facility Barriers to Discharge: No SNF bed, Continued Medical Work up  Expected Discharge Plan and Services Expected Discharge Plan: [place] Choice: [place] arrangements for the past 2 months: Single Family Home                                       Social Determinants of Health (SDOH) Interventions    Readmission Risk Interventions No flowsheet data found.

## 2020-05-21 NOTE — Progress Notes (Deleted)
Subjective:  POD #4 s/p right hip hemiarthroplasty.   Patient reports right hip pain as mild.  Patient sitting up in bed eating lunch.  MRI was negative for intracranial pathology despite acute mental status changes.  Surgical pathology for the femoral head is still pending.  Objective:   VITALS:   Vitals:   05/20/20 1609 05/20/20 2042 05/20/20 2345 05/21/20 0759  BP: (!) 143/94 (!) 153/99 (!) 144/90 (!) 156/99  Pulse: (!) 105 99 93 (!) 105  Resp: 16 16 19 16   Temp: 97.6 F (36.4 C) 98.6 F (37 C) 97.8 F (36.6 C) 98 F (36.7 C)  TempSrc: Oral Oral Oral Oral  SpO2: 98% 97% 98% 99%  Weight:      Height:        PHYSICAL EXAM: Right lower extremity Neurovascular intact Sensation intact distally Intact pulses distally Dorsiflexion/Plantar flexion intact Incision: dressing C/D/I No cellulitis present Compartment soft  LABS  Results for orders placed or performed during the hospital encounter of 05/16/20 (from the past 24 hour(s))  SARS CORONAVIRUS 2 (TAT 6-24 HRS) Nasopharyngeal Nasopharyngeal Swab     Status: None   Collection Time: 05/20/20  5:17 PM   Specimen: Nasopharyngeal Swab  Result Value Ref Range   SARS Coronavirus 2 NEGATIVE NEGATIVE  CBC     Status: Abnormal   Collection Time: 05/21/20  4:29 AM  Result Value Ref Range   WBC 7.7 4.0 - 10.5 K/uL   RBC 3.44 (L) 3.87 - 5.11 MIL/uL   Hemoglobin 9.1 (L) 12.0 - 15.0 g/dL   HCT 27.2 (L) 36 - 46 %   MCV 79.1 (L) 80.0 - 100.0 fL   MCH 26.5 26.0 - 34.0 pg   MCHC 33.5 30.0 - 36.0 g/dL   RDW 14.6 11.5 - 15.5 %   Platelets 225 150 - 400 K/uL   nRBC 0.0 0.0 - 0.2 %  Basic metabolic panel     Status: Abnormal   Collection Time: 05/21/20  4:29 AM  Result Value Ref Range   Sodium 145 135 - 145 mmol/L   Potassium 3.6 3.5 - 5.1 mmol/L   Chloride 104 98 - 111 mmol/L   CO2 30 22 - 32 mmol/L   Glucose, Bld 105 (H) 70 - 99 mg/dL   BUN 39 (H) 8 - 23 mg/dL   Creatinine, Ser 0.91 0.44 - 1.00 mg/dL   Calcium 9.4 8.9 -  10.3 mg/dL   GFR calc non Af Amer >60 >60 mL/min   GFR calc Af Amer >60 >60 mL/min   Anion gap 11 5 - 15    No results found.  Assessment/Plan: 4 Days Post-Op   Principal Problem:   Closed displaced fracture of right femoral neck (HCC) Active Problems:   Essential hypertension   Dementia without behavioral disturbance (HCC)   Postablative hypothyroidism   Fall at home, initial encounter   Hypokalemia   CKD (chronic kidney disease), stage IIIa  Patient stable from an orthopedic standpoint.  Still awaiting surgical pathology of the femoral head to determine if his fracture was a result of bladder cancer metastasis.  Patient will continue physical therapy.  Recommend he continue Lovenox 40 mg daily for 2 weeks postop.  Patient should follow-up in the office in 7 to 10 days for reevaluation, staple removal and x-ray.  Patient is weightbearing as tolerated the right lower extremity.  He should observe posterior hip precautions.  He may be discharged to skilled nursing facility when cleared medically.    Joanna Aguilar  Mack Guise , MD 05/21/2020, 12:22 PM

## 2020-05-21 NOTE — Progress Notes (Signed)
Occupational Therapy Treatment Patient Details Name: Joanna Aguilar MRN: [id number] DOB: [birth year] Today's Date: 05/21/2020    History of present illness Joanna Aguilar is a 79 y.o. female who was admitted to [facility] for right hip hemiarthroplasty repair of a displaced femoral neck fracture sustained after a fall down her attic steps. PMHx includes: HTN, Dementia, Hypothyroidism, CKD, Hypokalemia, Pt. has posterior hip precautions.   OT comments  Joanna Aguilar was seen for OT treatment on this date. Upon arrival to room pt awake and oriented to self only. Pt agreeable to tx. Pt instructed in IS use, pain management strategies, and reoriented to R hip fx. Requiring SETUP face washing at bed level - when handed wash cloth she asked "is this for me or the baby?" MOD A don L sock, MAX A don R sock at bed level. OT attempted bed mobility but pt could not tolerate - MAX A for BLE mgmt to EOB at which point pt reported R hip pain and resisted mobility. Pt limiting progress toward goals 2/2 poor command following/awareness of deficits. Pt continues to benefit from skilled OT services to maximize return to PLOF and minimize risk of future falls, injury, caregiver burden, and readmission. Will continue to follow POC. Discharge recommendation remains appropriate.    Follow Up Recommendations  SNF    Equipment Recommendations       Recommendations for Other Services      Precautions / Restrictions Precautions Precautions: Posterior Hip Precaution Booklet Issued: No Restrictions RLE Weight Bearing: Weight bearing as tolerated       Mobility Bed Mobility Overal bed mobility: Needs Assistance             General bed mobility comments: attempted but pt could not tolerate - MAX A for BLE mgmt to EOB at which point pt reported R hip pain - attempted to educate pt on hip fx but pt does not appear to understand.   Transfers                      Balance                                            ADL either performed or assessed with clinical judgement   ADL Overall ADL's : Needs assistance/impaired                                       General ADL Comments: SETUP face washing at bed level - when handed cloth she asked"is this for me or the baby?" MOD A don L sock, MAX A don R sock at bed level.      Vision       Perception     Praxis      Cognition Arousal/Alertness: Awake/alert Behavior During Therapy: Anxious;WFL for tasks assessed/performed;Impulsive Overall Cognitive Status: History of cognitive impairments - at baseline                                 General Comments: pt oriented to self only. When handed washcloth pt said "is this for me or the baby?"        Exercises Exercises: Other exercises Other Exercises Other Exercises: Pt educated re: R hip  fx, pain mgmt, importance of mobility for functional strengthening, IS (use) Other Exercises: LBD, face washing, attempted bed mobility, meal setup    Shoulder Instructions       General Comments      Pertinent Vitals/ Pain       Pain Assessment: Faces Faces Pain Scale: Hurts little more Pain Location: RLE, posterior Rt sacral area (well controlled until attempting bed mobility attempted) Pain Descriptors / Indicators: Grimacing;Guarding Pain Intervention(s): Limited activity within patient's tolerance  Home Living                                          Prior Functioning/Environment              Frequency  Min 2X/week        Progress Toward Goals  OT Goals(current goals can now be found in the care plan section)     Acute Rehab OT Goals Patient Stated Goal: To regain independence OT Goal Formulation: With patient Time For Goal Achievement: 06/04/20 Potential to Achieve Goals: Good  Plan      Co-evaluation                 AM-PAC OT "6 Clicks" Daily Activity     Outcome Measure   Help from another  person eating meals?: A Little Help from another person taking care of personal grooming?: A Little Help from another person toileting, which includes using toliet, bedpan, or urinal?: A Lot Help from another person bathing (including washing, rinsing, drying)?: A Lot Help from another person to put on and taking off regular upper body clothing?: A Lot Help from another person to put on and taking off regular lower body clothing?: A Lot 6 Click Score: 14    End of Session    OT Visit Diagnosis: Muscle weakness (generalized) (M62.81);History of falling (Z91.81)   Activity Tolerance Patient limited by pain (Pt limited by cognition)   Patient Left in bed;with call bell/phone within reach;with bed alarm set   Nurse Communication          Time: 3762-8315 OT Time Calculation (min): 16 min  Charges: OT General Charges $OT Visit: 1 Visit OT Treatments $Self Care/Home Management : 8-22 mins  Dessie Coma, M.S. OTR/L  05/21/20, 9:46 AM

## 2020-05-21 NOTE — Progress Notes (Signed)
Patient pulled IV out of RFA, placed gauze and IV team consult,. Mittens placed to BUE and given PRN norco for pain ,

## 2020-05-21 NOTE — Progress Notes (Signed)
PROGRESS NOTE    Joanna Aguilar  CNO:[id number] DOB: 1941-08-18 DOA: 05/16/2020 PCP: Lavera Guise, MD   Brief Narrative:  Patient is a 79 year old female with dementia, HTN, hypothyroidism and CKD 3 who was admitted with right hip fracture after mechanical fall.  Orthopedic was consulted and she underwent right hemiarthroplasty on 05/17/2020.  Tolerated the procedure well.  She developed some urinary retention postoperatively requiring Foley catheter.  Patient pulled the catheter herself on 05/20/2020 without any significant injury.  Subjective: Patient has no new complaint today.  Pain is well controlled.  No bowel movement yet.  Able to void without any difficulty.  Daughter was in the room.  Assessment & Plan:   Principal Problem:   Closed displaced fracture of right femoral neck (HCC) Active Problems:   Essential hypertension   Dementia without behavioral disturbance (HCC)   Postablative hypothyroidism   Fall at home, initial encounter   Hypokalemia   CKD (chronic kidney disease), stage IIIa  Closed displaced fracture of right femoral neck Status post right hip hemiarthroplasty earlier today, patient tolerated without immediate complication. -PT is recommending SNF placement.  Waiting insurance authorization. -Continue DVT prophylaxis and pain management per orthopedic protocol.  Urinary retention.  Resolved.  Patient had urinary retention postoperatively requiring Foley in place.  UA without any pyuria or sign of infection.  Patient self pulled the catheter this morning without any significant injury.   Constipation.  No BM despite getting MiraLAX.  -Try Dulcolax suppository and Fleet enema. -Patient will need a scheduled bowel regimen.  Anemia.  Most likely secondary to blood loss during surgery. Hemoglobin started improving.  Iron studies with anemia of chronic disease. -Continue iron supplement. -Continue to monitor.  Essential hypertension.  Blood pressure mildly  elevated. -Continue home dose of amlodipine. -Continue to monitor.  Dementia without behavioral disturbance (HCC) Continue home doses of donepezil and Namenda  Postablative hypothyroidism Continue Synthroid  CKD stage IIIa Kidney function appears to be at baseline, creatinine at 0.91 today. Avoid renal toxic medication and or dehydration  Objective: Vitals:   05/20/20 2042 05/20/20 2345 05/21/20 0759 05/21/20 1519  BP: (!) 153/99 (!) 144/90 (!) 156/99 (!) 144/105  Pulse: 99 93 (!) 105 85  Resp: 16 19 16 16   Temp: 98.6 F (37 C) 97.8 F (36.6 C) 98 F (36.7 C) 98.2 F (36.8 C)  TempSrc: Oral Oral Oral Oral  SpO2: 97% 98% 99% 99%  Weight:      Height:        Intake/Output Summary (Last 24 hours) at 05/21/2020 1802 Last data filed at 05/21/2020 2947 Gross per 24 hour  Intake 0 ml  Output 75 ml  Net -75 ml   Filed Weights   05/16/20 1202  Weight: 36.3 kg    Examination:  General exam: Frail, emaciated elderly lady, appears calm and comfortable, sitting comfortably in chair. Respiratory system: Clear to auscultation. Respiratory effort normal. Cardiovascular system: S1 & S2 heard, RRR. No JVD, murmurs, Gastrointestinal system: Soft, nontender, nondistended, bowel sounds positive. Central nervous system: Alert and oriented. No focal neurological deficits. Extremities: No edema, no cyanosis, pulses intact and symmetrical. Psychiatry: Judgement and insight appear impaired.  DVT prophylaxis: Lovenox Code Status: DNR Family Communication: Daughter was updated at bedside Disposition Plan:  Status is: Inpatient  Remains inpatient appropriate because:Inpatient level of care appropriate due to severity of illness   Dispo: The patient is from: Home              Anticipated d/c  is to: SNF              Anticipated d/c date is: 1 day              Patient currently is not medically stable to d/c.  Awaiting insurance authorization.  No BM since surgery, working on  it.  Consultants:   Orthopedic  Procedures:  Antimicrobials:   Data Reviewed: I have personally reviewed following labs and imaging studies  CBC: Recent Labs  Lab 05/16/20 1130 05/16/20 1130 05/17/20 0600 05/18/20 0404 05/19/20 0440 05/20/20 0446 05/21/20 0429  WBC 6.7   < > 7.6 8.4 9.4 8.6 7.7  NEUTROABS 5.4  --   --   --   --   --   --   HGB 10.7*   < > 11.8* 9.9* 9.1* 8.4* 9.1*  HCT 31.9*   < > 34.4* 29.9* 28.0* 25.8* 27.2*  MCV 80.2   < > 78.5* 79.1* 81.2 80.6 79.1*  PLT 162   < > 169 159 150 187 225   < > = values in this interval not displayed.   Basic Metabolic Panel: Recent Labs  Lab 05/17/20 0600 05/18/20 0404 05/19/20 0440 05/20/20 0446 05/21/20 0429  NA 137 135 140 142 145  K 3.8 3.9 4.1 3.8 3.6  CL 98 100 105 107 104  CO2 27 27 28 27 30   GLUCOSE 140* 114* 84 99 105*  BUN 21 33* 42* 45* 39*  CREATININE 1.10* 1.53* 1.32* 1.12* 0.91  CALCIUM 9.5 8.9 8.6* 8.8* 9.4  MG 1.9  --   --   --   --    GFR: Estimated Creatinine Clearance: 30.1 mL/min (by C-G formula based on SCr of 0.91 mg/dL). Liver Function Tests: Recent Labs  Lab 05/16/20 1130  AST 25  ALT 14  ALKPHOS 58  BILITOT 0.9  PROT 7.7  ALBUMIN 4.0   No results for input(s): LIPASE, AMYLASE in the last 168 hours. No results for input(s): AMMONIA in the last 168 hours. Coagulation Profile: Recent Labs  Lab 05/16/20 1130  INR 0.9   Cardiac Enzymes: No results for input(s): CKTOTAL, CKMB, CKMBINDEX, TROPONINI in the last 168 hours. BNP (last 3 results) No results for input(s): PROBNP in the last 8760 hours. HbA1C: No results for input(s): HGBA1C in the last 72 hours. CBG: No results for input(s): GLUCAP in the last 168 hours. Lipid Profile: No results for input(s): CHOL, HDL, LDLCALC, TRIG, CHOLHDL, LDLDIRECT in the last 72 hours. Thyroid Function Tests: No results for input(s): TSH, T4TOTAL, FREET4, T3FREE, THYROIDAB in the last 72 hours. Anemia Panel: No results for input(s):  VITAMINB12, FOLATE, FERRITIN, TIBC, IRON, RETICCTPCT in the last 72 hours. Sepsis Labs: No results for input(s): PROCALCITON, LATICACIDVEN in the last 168 hours.  Recent Results (from the past 240 hour(s))  SARS Coronavirus 2 by RT PCR (hospital order, performed in [facility] hospital lab) Nasopharyngeal Nasopharyngeal Swab     Status: None   Collection Time: 05/16/20 11:30 AM   Specimen: Nasopharyngeal Swab  Result Value Ref Range Status   SARS Coronavirus 2 NEGATIVE NEGATIVE Final    Comment: (NOTE) SARS-CoV-2 target nucleic acids are NOT DETECTED.  The SARS-CoV-2 RNA is generally detectable in upper and lower respiratory specimens during the acute phase of infection. The lowest concentration of SARS-CoV-2 viral copies this assay can detect is 250 copies / mL. A negative result does not preclude SARS-CoV-2 infection and should not be used as the sole basis for treatment  or other patient management decisions.  A negative result may occur with improper specimen collection / handling, submission of specimen other than nasopharyngeal swab, presence of viral mutation(s) within the areas targeted by this assay, and inadequate number of viral copies (<250 copies / mL). A negative result must be combined with clinical observations, patient history, and epidemiological information.  Fact Sheet for Patients:   StrictlyIdeas.no  Fact Sheet for Healthcare Providers: BankingDealers.co.za  This test is not yet approved or  cleared by the [place] FDA and has been authorized for detection and/or diagnosis of SARS-CoV-2 by FDA under an Emergency Use Authorization (EUA).  This EUA will remain in effect (meaning this test can be used) for the duration of the COVID-19 declaration under Section 564(b)(1) of the Act, 21 U.S.C. section 360bbb-3(b)(1), unless the authorization is terminated or revoked sooner.  Performed at [facility],  [address], [place], [place] [address]   Surgical PCR screen     Status: Abnormal   Collection Time: 05/16/20 11:34 PM   Specimen: Nasal Mucosa; Nasal Swab  Result Value Ref Range Status   MRSA, PCR NEGATIVE NEGATIVE Final   Staphylococcus aureus POSITIVE (A) NEGATIVE Final    Comment: (NOTE) The Xpert SA Assay (FDA approved for NASAL specimens in patients 31 years of age and older), is one component of a comprehensive surveillance program. It is not intended to diagnose infection nor to guide or monitor treatment. Performed at [facility], [place], [place] [address]   SARS CORONAVIRUS 2 (TAT 6-24 HRS) Nasopharyngeal Nasopharyngeal Swab     Status: None   Collection Time: 05/20/20  5:17 PM   Specimen: Nasopharyngeal Swab  Result Value Ref Range Status   SARS Coronavirus 2 NEGATIVE NEGATIVE Final    Comment: (NOTE) SARS-CoV-2 target nucleic acids are NOT DETECTED.  The SARS-CoV-2 RNA is generally detectable in upper and lower respiratory specimens during the acute phase of infection. Negative results do not preclude SARS-CoV-2 infection, do not rule out co-infections with other pathogens, and should not be used as the sole basis for treatment or other patient management decisions. Negative results must be combined with clinical observations, patient history, and epidemiological information. The expected result is Negative.  Fact Sheet for Patients: SugarRoll.be  Fact Sheet for Healthcare Providers: https://www.woods-mathews.com/  This test is not yet approved or cleared by the [place] FDA and  has been authorized for detection and/or diagnosis of SARS-CoV-2 by FDA under an Emergency Use Authorization (EUA). This EUA will remain  in effect (meaning this test can be used) for the duration of the COVID-19 declaration under Se ction 564(b)(1) of the Act, 21 U.S.C. section 360bbb-3(b)(1), unless the  authorization is terminated or revoked sooner.  Performed at [facility], [place] [address], [place], [place] [address]      Radiology Studies: DG HIP UNILAT W OR W/O PELVIS 1V RIGHT  Result Date: 05/21/2020 CLINICAL DATA:  Recent total hip replacement on the right EXAM: DG HIP (WITH OR WITHOUT PELVIS) 1V RIGHT COMPARISON:  May 17, 2020 FINDINGS: Frontal pelvis as well as frontal and lateral right hip images were obtained. Note that a small portion of the superior pelvis is not included. There is a total hip replacement on the right with prosthetic components well-seated. No fracture or dislocation. Left hip joint appears unremarkable. IMPRESSION: Status post total hip replacement on the right with prosthetic components well-seated. No fracture or dislocation. No appreciable arthropathy on left. Electronically Signed  By: Lowella Grip III M.D.   On: 05/21/2020 12:59    Scheduled Meds: . amLODipine  5 mg Oral Daily  . Chlorhexidine Gluconate Cloth  6 each Topical Q0600  . docusate sodium  100 mg Oral BID  . donepezil  10 mg Oral Daily  . enoxaparin (LOVENOX) injection  30 mg Subcutaneous Q24H  . feeding supplement (ENSURE ENLIVE)  237 mL Oral Q24H  . ferrous sulfate  325 mg Oral BID WC  . levothyroxine  88 mcg Oral Daily  . memantine  5 mg Oral Daily  . multivitamin with minerals  1 tablet Oral Daily  . mupirocin ointment  1 application Nasal BID  . nutrition supplement (JUVEN)  1 packet Oral BID BM  . polyethylene glycol  17 g Oral Daily  . senna  1 tablet Oral BID  . traMADol  50 mg Oral Q6H   Continuous Infusions: . sodium chloride 75 mL/hr at 05/21/20 0342     LOS: 5 days   Time spent: 30 minutes.  Lorella Nimrod, MD [facility]  If 7PM-7AM, please contact night-coverage [email or web address]  05/21/2020, 6:02 PM   This record has been created using Dragon voice recognition software. Errors have been sought and corrected,but may not always be located. Such  creation errors do not reflect on the standard of care.

## 2020-05-22 DIAGNOSIS — R2681 Unsteadiness on feet: Secondary | ICD-10-CM | POA: Diagnosis not present

## 2020-05-22 DIAGNOSIS — S72001D Fracture of unspecified part of neck of right femur, subsequent encounter for closed fracture with routine healing: Secondary | ICD-10-CM | POA: Diagnosis not present

## 2020-05-22 DIAGNOSIS — E890 Postprocedural hypothyroidism: Secondary | ICD-10-CM | POA: Diagnosis not present

## 2020-05-22 DIAGNOSIS — R279 Unspecified lack of coordination: Secondary | ICD-10-CM | POA: Diagnosis not present

## 2020-05-22 DIAGNOSIS — W19XXXD Unspecified fall, subsequent encounter: Secondary | ICD-10-CM | POA: Diagnosis not present

## 2020-05-22 DIAGNOSIS — Z4789 Encounter for other orthopedic aftercare: Secondary | ICD-10-CM | POA: Diagnosis not present

## 2020-05-22 DIAGNOSIS — R6889 Other general symptoms and signs: Secondary | ICD-10-CM | POA: Diagnosis not present

## 2020-05-22 DIAGNOSIS — D378 Neoplasm of uncertain behavior of other specified digestive organs: Secondary | ICD-10-CM | POA: Diagnosis not present

## 2020-05-22 DIAGNOSIS — I129 Hypertensive chronic kidney disease with stage 1 through stage 4 chronic kidney disease, or unspecified chronic kidney disease: Secondary | ICD-10-CM | POA: Diagnosis not present

## 2020-05-22 DIAGNOSIS — I7090 Unspecified atherosclerosis: Secondary | ICD-10-CM | POA: Diagnosis not present

## 2020-05-22 DIAGNOSIS — N183 Chronic kidney disease, stage 3 unspecified: Secondary | ICD-10-CM | POA: Diagnosis not present

## 2020-05-22 DIAGNOSIS — S72001A Fracture of unspecified part of neck of right femur, initial encounter for closed fracture: Secondary | ICD-10-CM | POA: Diagnosis not present

## 2020-05-22 DIAGNOSIS — N1831 Chronic kidney disease, stage 3a: Secondary | ICD-10-CM | POA: Diagnosis not present

## 2020-05-22 DIAGNOSIS — E876 Hypokalemia: Secondary | ICD-10-CM | POA: Diagnosis not present

## 2020-05-22 DIAGNOSIS — R262 Difficulty in walking, not elsewhere classified: Secondary | ICD-10-CM | POA: Diagnosis not present

## 2020-05-22 DIAGNOSIS — Z743 Need for continuous supervision: Secondary | ICD-10-CM | POA: Diagnosis not present

## 2020-05-22 DIAGNOSIS — E039 Hypothyroidism, unspecified: Secondary | ICD-10-CM | POA: Diagnosis not present

## 2020-05-22 DIAGNOSIS — R404 Transient alteration of awareness: Secondary | ICD-10-CM | POA: Diagnosis not present

## 2020-05-22 DIAGNOSIS — D649 Anemia, unspecified: Secondary | ICD-10-CM | POA: Diagnosis not present

## 2020-05-22 DIAGNOSIS — M6281 Muscle weakness (generalized): Secondary | ICD-10-CM | POA: Diagnosis not present

## 2020-05-22 DIAGNOSIS — I10 Essential (primary) hypertension: Secondary | ICD-10-CM | POA: Diagnosis not present

## 2020-05-22 DIAGNOSIS — Z09 Encounter for follow-up examination after completed treatment for conditions other than malignant neoplasm: Secondary | ICD-10-CM | POA: Diagnosis not present

## 2020-05-22 DIAGNOSIS — S72041D Displaced fracture of base of neck of right femur, subsequent encounter for closed fracture with routine healing: Secondary | ICD-10-CM | POA: Diagnosis not present

## 2020-05-22 LAB — SURGICAL PATHOLOGY

## 2020-05-22 MED ORDER — ENSURE ENLIVE PO LIQD: 237.0000 mL | ORAL | 12 refills | Status: AC

## 2020-05-22 MED ORDER — FERROUS SULFATE 325 (65 FE) MG PO TABS: 325.0000 mg | ORAL_TABLET | Freq: Two times a day (BID) | ORAL | 3 refills | Status: AC

## 2020-05-22 MED ORDER — BISACODYL 10 MG RE SUPP: 10.0000 mg | Freq: Every day | RECTAL | 0 refills | Status: AC | PRN

## 2020-05-22 MED ORDER — SENNA 8.6 MG PO TABS: 1.0000 | ORAL_TABLET | Freq: Two times a day (BID) | ORAL | 0 refills | Status: AC

## 2020-05-22 MED ORDER — ADULT MULTIVITAMIN W/MINERALS CH: 1.0000 | ORAL_TABLET | Freq: Every day | ORAL | Status: AC

## 2020-05-22 MED ORDER — ENOXAPARIN SODIUM 30 MG/0.3ML ~~LOC~~ SOLN: 30.0000 mg | SUBCUTANEOUS | Status: AC

## 2020-05-22 MED ORDER — JUVEN PO PACK: 1.0000 | PACK | Freq: Two times a day (BID) | ORAL | 0 refills | Status: AC

## 2020-05-22 MED ORDER — POLYETHYLENE GLYCOL 3350 17 G PO PACK: 17.0000 g | PACK | Freq: Every day | ORAL | 0 refills | Status: AC

## 2020-05-22 MED ORDER — HYDROCODONE-ACETAMINOPHEN 5-325 MG PO TABS: 1.0000 | ORAL_TABLET | ORAL | 0 refills | Status: AC | PRN

## 2020-05-22 MED ORDER — DOCUSATE SODIUM 100 MG PO CAPS: 100.0000 mg | ORAL_CAPSULE | Freq: Two times a day (BID) | ORAL | 0 refills | Status: AC

## 2020-05-22 MED ORDER — MAGNESIUM CITRATE PO SOLN: 1.0000 | Freq: Once | ORAL | Status: AC | PRN

## 2020-05-22 NOTE — Progress Notes (Signed)
  Subjective:  POD #5 s/p right hip hemiarthroplasty.   Patient reports right hip pain as mild.  Patient wearing her hip abduction brace while in bed today.  Objective:   VITALS:   Vitals:   05/21/20 1519 05/22/20 0016 05/22/20 0727 05/22/20 1351  BP: (!) 144/105 (!) 138/125 (!) 150/100 (!) 143/106  Pulse: 85 93 83 86  Resp: 16 18 18 18   Temp: 98.2 F (36.8 C) 98 F (36.7 C) 97.9 F (36.6 C) 99.1 F (37.3 C)  TempSrc: Oral Oral Oral Oral  SpO2: 99% 100% 100% 100%  Weight:      Height:        PHYSICAL EXAM: Right lower extremity Neurovascular intact Sensation intact distally Intact pulses distally Dorsiflexion/Plantar flexion intact Incision: dressing C/D/I No cellulitis present Compartment soft  LABS  No results found for this or any previous visit (from the past 24 hour(s)).  DG HIP UNILAT W OR W/O PELVIS 1V RIGHT  Result Date: 05/21/2020 CLINICAL DATA:  Recent total hip replacement on the right EXAM: DG HIP (WITH OR WITHOUT PELVIS) 1V RIGHT COMPARISON:  May 17, 2020 FINDINGS: Frontal pelvis as well as frontal and lateral right hip images were obtained. Note that a small portion of the superior pelvis is not included. There is a total hip replacement on the right with prosthetic components well-seated. No fracture or dislocation. Left hip joint appears unremarkable. IMPRESSION: Status post total hip replacement on the right with prosthetic components well-seated. No fracture or dislocation. No appreciable arthropathy on left. Electronically Signed   By: Lowella Grip III M.D.   On: 05/21/2020 12:59    Assessment/Plan: 5 Days Post-Op   Principal Problem:   Closed displaced fracture of right femoral neck (HCC) Active Problems:   Essential hypertension   Dementia without behavioral disturbance ([place])   Postablative hypothyroidism   Fall at home, initial encounter   Hypokalemia   CKD (chronic kidney disease), stage IIIa  Patient is stable from an orthopedic  standpoint.  Patient is weightbearing as tolerated on the right lower extremity and must continue to observe posterior hip precautions.  She should wear her abduction pillow while in bed.  Pillow may be removed while she is out of bed with physical therapy or sitting in the chair.  Patient will continue Lovenox 30 mg daily for DVT prophylaxis until her follow-up in the office in 10 to 14 days.    [place] , MD 05/22/2020, 2:12 PM

## 2020-05-22 NOTE — Progress Notes (Signed)
PT Cancellation Note  Patient Details Name: Joanna Aguilar MRN: [id number] DOB: 01-26-1941   Cancelled Treatment:     PT attempt x 2 this morning. Pt refused. " I'm to tired to work right now." Acute PT will continue efforts until pt is DC to SNF.    Willette Pa 05/22/2020, 11:57 AM

## 2020-05-22 NOTE — Progress Notes (Signed)
Report called to Deatra Ina, RN @ [facility], case Energy manager EMS for transportation.

## 2020-05-22 NOTE — TOC Progression Note (Signed)
Transition of Care [facility]) - Progression Note    Patient Details  Name: Joanna Aguilar MRN: [id number] Date of Birth: 1941-07-26  Transition of Care [facility]) CM/SW [place], RN Phone Number: 05/22/2020, 10:34 AM  Clinical Narrative:   Received notification that insurance has been approved, The DC packet has been placed on the chart including the DNR, Awaiting DC summary and orders, the Nurse will call report to the facility and [facility] will call EMS when ready    Expected Discharge Plan: [place] Barriers to Discharge: No SNF bed, Continued Medical Work up  Expected Discharge Plan and Services Expected Discharge Plan: [place] Choice: [place] arrangements for the past 2 months: Single Family Home                                       Social Determinants of Health (SDOH) Interventions    Readmission Risk Interventions No flowsheet data found.

## 2020-05-22 NOTE — TOC Progression Note (Signed)
Transition of Care [facility]) - Progression Note    Patient Details  Name: Joanna Aguilar MRN: [id number] Date of Birth: 1941/01/05  Transition of Care [facility]) CM/SW [place], RN Phone Number: 05/22/2020, 1:46 PM  Clinical Narrative:    [facility] CM called EMS for transport, the bedside nurse called report and the patient is ready, The daughter is aware   Expected Discharge Plan: Skilled Nursing Facility Barriers to Discharge: No SNF bed, Continued Medical Work up  Expected Discharge Plan and Services Expected Discharge Plan: [place] Choice: [place] arrangements for the past 2 months: Single Family Home Expected Discharge Date: 05/22/20                                     Social Determinants of Health (SDOH) Interventions    Readmission Risk Interventions No flowsheet data found.

## 2020-05-22 NOTE — Discharge Summary (Signed)
Joanna Aguilar WNI:[id number] DOB: [birth year] DOA: 05/16/2020  PCP: Lavera Guise, MD  Admit date: 05/16/2020 Discharge date: 05/22/2020  Admitted From: home Disposition:  [place]  Recommendations for Outpatient Follow-up:  1. Follow up with PCP in 1 week 2. Please obtain BMP/CBC in one week 3. Follow up with orthopedics Dr. Mack Guise in 2 weeks 4. Lovenox only for 2 weeks. Ends July 17th.     Discharge Condition:Stable CODE STATUS:DNR  Diet recommendation:  Regular, with protein supplements. Brief/Interim Summary: Patient is a 79 year old female with dementia, HTN, hypothyroidism and CKD 3 who was admitted with right hip fracture after mechanical fall.  Orthopedic was consulted and she underwent right hemiarthroplasty on 05/17/2020.  Tolerated the procedure well.  She developed some urinary retention postoperatively requiring Foley catheter.  Patient pulled the catheter herself on 05/20/2020 without any significant injury. She had constipation, but has been having bowel movements now after being placed on bowel regimen.  Closed displaced fracture of right femoral neck Status post right hip hemiarthroplastypatient tolerated without immediate complication. -PT is recommending SNF placement.  Going to [facility] -pain mx -Lovenox per ortho for 2 weeks F/u with Orthopedics in 2 weeks  Urinary retention.  Resolved. Urinating now.  Patient had urinary retention postoperatively requiring Foley in place.  UA without any pyuria or sign of infection.  Patient self pulled the catheter this morning without any significant injury.   Constipation.  Resolved now.  -continue bowel regimen as outpatient   Anemia.  Most likely secondary to blood loss during surgery. Hemoglobin started improving.  Iron studies with anemia of chronic disease. -Continue iron supplement. -Continue to monitor as outpatient  Essential hypertension.  Blood pressure mildly elevated. -Continue  home dose of amlodipine. -Continue to monitor.  Dementia without behavioral disturbance (HCC) Continue home doses of donepezil and Namenda  Postablative hypothyroidism Continue Synthroid  CKD stage IIIa Kidney function appears to be at baseline, creatinine at 0.91 today. Avoid renal toxic medication and or dehydration   Discharge Diagnoses:  Principal Problem:   Closed displaced fracture of right femoral neck (HCC) Active Problems:   Essential hypertension   Dementia without behavioral disturbance ([place])   Postablative hypothyroidism   Fall at home, initial encounter   Hypokalemia   CKD (chronic kidney disease), stage IIIa    Discharge Instructions  Discharge Instructions    Call MD for:  temperature >100.4   Complete by: As directed    Diet - low sodium heart healthy   Complete by: As directed    Increase activity slowly   Complete by: As directed    Leave dressing on - Keep it clean, dry, and intact until clinic visit   Complete by: As directed      Allergies as of 05/22/2020      Reactions   Penicillins Rash      Medication List    TAKE these medications   amLODipine 5 MG tablet Commonly known as: NORVASC TAKE 1 TABLET BY MOUTH EVERY MORNING FOR HTN What changed:   how much to take  how to take this  when to take this  additional instructions   bisacodyl 10 MG suppository Commonly known as: DULCOLAX Place 1 suppository (10 mg total) rectally daily as needed for moderate constipation.   docusate sodium 100 MG capsule Commonly known as: COLACE Take 1 capsule (100 mg total) by mouth 2 (two) times daily.   donepezil 10 MG tablet Commonly known as: ARICEPT TAKE ONE TABLET BY  MOUTH ONCE DAILY IN THE MORNING FOR MEMORY What changed: See the new instructions.   enoxaparin 30 MG/0.3ML injection Commonly known as: LOVENOX Inject 0.3 mLs (30 mg total) into the skin daily for 9 days. Start taking on: May 23, 2020   ergocalciferol 1.25 MG (50000  UT) capsule Commonly known as: Drisdol Take 1 capsule (50,000 Units total) by mouth once a week.   nutrition supplement (JUVEN) Pack Take 1 packet by mouth 2 (two) times daily between meals.   feeding supplement (ENSURE ENLIVE) Liqd Take 237 mLs by mouth daily. Start taking on: May 23, 2020   ferrous sulfate 325 (65 FE) MG tablet Take 1 tablet (325 mg total) by mouth 2 (two) times daily with a meal.   HYDROcodone-acetaminophen 5-325 MG tablet Commonly known as: NORCO/VICODIN Take 1-2 tablets by mouth every 4 (four) hours as needed for up to 3 days for moderate pain (pain score 4-6).   magnesium citrate Soln Take 296 mLs (1 Bottle total) by mouth once as needed for severe constipation.   memantine 5 MG tablet Commonly known as: NAMENDA TAKE 1 TABLET BY MOUTH EVERY DAY FOR MEMORY What changed: See the new instructions.   multivitamin with minerals Tabs tablet Take 1 tablet by mouth daily. Start taking on: May 23, 2020   polyethylene glycol 17 g packet Commonly known as: MIRALAX / GLYCOLAX Take 17 g by mouth daily. Start taking on: May 23, 2020   senna 8.6 MG Tabs tablet Commonly known as: SENOKOT Take 1 tablet (8.6 mg total) by mouth 2 (two) times daily.   Synthroid 88 MCG tablet Generic drug: levothyroxine Take 88 mcg by mouth daily.   triamcinolone cream 0.1 % Commonly known as: KENALOG Apply 1 application topically 2 (two) times daily.            Discharge Care Instructions  (From admission, onward)         Start     Ordered   05/22/20 0000  Leave dressing on - Keep it clean, dry, and intact until clinic visit        05/22/20 1252          Contact information for follow-up providers    Thornton Park, MD Follow up in 2 week(s).   Specialty: Orthopedic Surgery Contact information: [place] [address] [phone]            Contact information for after-discharge care    [place]  Preferred SNF .   Service: Skilled Nursing Contact information: [place] [phone]                 Allergies  Allergen Reactions  . Penicillins Rash    Consultations:  orhtopedics  Procedures/Studies: CT Head Wo Contrast  Result Date: 05/16/2020 CLINICAL DATA:  Head trauma, headache, fall, dementia EXAM: CT HEAD WITHOUT CONTRAST CT CERVICAL SPINE WITHOUT CONTRAST TECHNIQUE: Multidetector CT imaging of the head and cervical spine was performed following the standard protocol without intravenous contrast. Multiplanar CT image reconstructions of the cervical spine were also generated. COMPARISON:  None. FINDINGS: CT HEAD FINDINGS Brain: No evidence of acute infarction, hemorrhage, hydrocephalus, extra-axial collection or mass lesion/mass effect. Extensive periventricular and deep white matter hypodensity. Mild global cerebral volume loss. Vascular: No hyperdense vessel or unexpected calcification. Skull: Normal. Negative for fracture or focal lesion. Sinuses/Orbits: No acute finding. Other: None. CT CERVICAL SPINE FINDINGS Alignment: Normal. Skull base and vertebrae: No  acute fracture. No primary bone lesion or focal pathologic process. Soft tissues and spinal canal: No prevertebral fluid or swelling. No visible canal hematoma. Disc levels:  Intact. Upper chest: Negative. Other: None. IMPRESSION: 1. No acute intracranial pathology. 2. Advanced small-vessel white matter disease and mild global cerebral volume loss. 3. No fracture or static subluxation of the cervical spine. Electronically Signed   By: Eddie Candle M.D.   On: 05/16/2020 12:58   CT Cervical Spine Wo Contrast  Result Date: 05/16/2020 CLINICAL DATA:  Head trauma, headache, fall, dementia EXAM: CT HEAD WITHOUT CONTRAST CT CERVICAL SPINE WITHOUT CONTRAST TECHNIQUE: Multidetector CT imaging of the head and cervical spine was performed following the standard protocol without intravenous  contrast. Multiplanar CT image reconstructions of the cervical spine were also generated. COMPARISON:  None. FINDINGS: CT HEAD FINDINGS Brain: No evidence of acute infarction, hemorrhage, hydrocephalus, extra-axial collection or mass lesion/mass effect. Extensive periventricular and deep white matter hypodensity. Mild global cerebral volume loss. Vascular: No hyperdense vessel or unexpected calcification. Skull: Normal. Negative for fracture or focal lesion. Sinuses/Orbits: No acute finding. Other: None. CT CERVICAL SPINE FINDINGS Alignment: Normal. Skull base and vertebrae: No acute fracture. No primary bone lesion or focal pathologic process. Soft tissues and spinal canal: No prevertebral fluid or swelling. No visible canal hematoma. Disc levels:  Intact. Upper chest: Negative. Other: None. IMPRESSION: 1. No acute intracranial pathology. 2. Advanced small-vessel white matter disease and mild global cerebral volume loss. 3. No fracture or static subluxation of the cervical spine. Electronically Signed   By: Eddie Candle M.D.   On: 05/16/2020 12:58   DG Chest Portable 1 View  Result Date: 05/16/2020 CLINICAL DATA:  Preop for hip fracture. EXAM: PORTABLE CHEST 1 VIEW COMPARISON:  December 11, 2018. FINDINGS: The heart size and mediastinal contours are within normal limits. Both lungs are clear. The visualized skeletal structures are unremarkable. IMPRESSION: No active disease. Electronically Signed   By: Marijo Conception M.D.   On: 05/16/2020 11:59   DG HIP UNILAT W OR W/O PELVIS 1V RIGHT  Result Date: 05/21/2020 CLINICAL DATA:  Recent total hip replacement on the right EXAM: DG HIP (WITH OR WITHOUT PELVIS) 1V RIGHT COMPARISON:  May 17, 2020 FINDINGS: Frontal pelvis as well as frontal and lateral right hip images were obtained. Note that a small portion of the superior pelvis is not included. There is a total hip replacement on the right with prosthetic components well-seated. No fracture or dislocation. Left hip  joint appears unremarkable. IMPRESSION: Status post total hip replacement on the right with prosthetic components well-seated. No fracture or dislocation. No appreciable arthropathy on left. Electronically Signed   By: Lowella Grip III M.D.   On: 05/21/2020 12:59   DG Hip Port Unilat With Pelvis 1V Right  Result Date: 05/17/2020 CLINICAL DATA:  Hemiarthroplasty EXAM: DG HIP (WITH OR WITHOUT PELVIS) 1V PORT RIGHT COMPARISON:  05/16/2020 FINDINGS: Interval right hip hemiarthroplasty with normal alignment. Gas in the soft tissues consistent with recent surgery. IMPRESSION: Interval right hip hemiarthroplasty with expected postsurgical change. Electronically Signed   By: Donavan Foil M.D.   On: 05/17/2020 16:27   DG Hip Unilat W or Wo Pelvis 2-3 Views Right  Result Date: 05/16/2020 CLINICAL DATA:  Right hip pain after fall. EXAM: DG HIP (WITH OR WITHOUT PELVIS) 2-3V RIGHT COMPARISON:  None. FINDINGS: Moderately displaced fracture is seen involving proximal right femoral neck. No dislocation is noted. Left hip is unremarkable. IMPRESSION: Moderately displaced proximal right femoral  neck fracture. Electronically Signed   By: Marijo Conception M.D.   On: 05/16/2020 11:58       Subjective: Pt without complaints this am. Had bm, also urinated. Daughter at bedside.   Discharge Exam: Vitals:   05/22/20 0016 05/22/20 0727  BP: (!) 138/125 (!) 150/100  Pulse: 93 83  Resp: 18 18  Temp: 98 F (36.7 C) 97.9 F (36.6 C)  SpO2: 100% 100%   Vitals:   05/21/20 0759 05/21/20 1519 05/22/20 0016 05/22/20 0727  BP: (!) 156/99 (!) 144/105 (!) 138/125 (!) 150/100  Pulse: (!) 105 85 93 83  Resp: 16 16 18 18   Temp: 98 F (36.7 C) 98.2 F (36.8 C) 98 F (36.7 C) 97.9 F (36.6 C)  TempSrc: Oral Oral Oral Oral  SpO2: 99% 99% 100% 100%  Weight:      Height:        General: Pt is alert, awake, not in acute distress Cardiovascular: RRR, S1/S2 +, no rubs, no gallops Respiratory: CTA bilaterally, no  wheezing, no rhonchi Abdominal: Soft, NT, ND, bowel sounds + Extremities: no edema, no cyanosis    The results of significant diagnostics from this hospitalization (including imaging, microbiology, ancillary and laboratory) are listed below for reference.     Microbiology: Recent Results (from the past 240 hour(s))  SARS Coronavirus 2 by RT PCR (hospital order, performed in [facility] hospital lab) Nasopharyngeal Nasopharyngeal Swab     Status: None   Collection Time: 05/16/20 11:30 AM   Specimen: Nasopharyngeal Swab  Result Value Ref Range Status   SARS Coronavirus 2 NEGATIVE NEGATIVE Final    Comment: (NOTE) SARS-CoV-2 target nucleic acids are NOT DETECTED.  The SARS-CoV-2 RNA is generally detectable in upper and lower respiratory specimens during the acute phase of infection. The lowest concentration of SARS-CoV-2 viral copies this assay can detect is 250 copies / mL. A negative result does not preclude SARS-CoV-2 infection and should not be used as the sole basis for treatment or other patient management decisions.  A negative result may occur with improper specimen collection / handling, submission of specimen other than nasopharyngeal swab, presence of viral mutation(s) within the areas targeted by this assay, and inadequate number of viral copies (<250 copies / mL). A negative result must be combined with clinical observations, patient history, and epidemiological information.  Fact Sheet for Patients:   StrictlyIdeas.no  Fact Sheet for Healthcare Providers: BankingDealers.co.za  This test is not yet approved or  cleared by the [place] FDA and has been authorized for detection and/or diagnosis of SARS-CoV-2 by FDA under an Emergency Use Authorization (EUA).  This EUA will remain in effect (meaning this test can be used) for the duration of the COVID-19 declaration under Section 564(b)(1) of the Act, 21  U.S.C. section 360bbb-3(b)(1), unless the authorization is terminated or revoked sooner.  Performed at [facility], [address] [place], [place] [address]   Surgical PCR screen     Status: Abnormal   Collection Time: 05/16/20 11:34 PM   Specimen: Nasal Mucosa; Nasal Swab  Result Value Ref Range Status   MRSA, PCR NEGATIVE NEGATIVE Final   Staphylococcus aureus POSITIVE (A) NEGATIVE Final    Comment: (NOTE) The Xpert SA Assay (FDA approved for NASAL specimens in patients 64 years of age and older), is one component of a comprehensive surveillance program. It is not intended to diagnose infection nor to guide or monitor treatment. Performed at [facility], [place],  [place], [place] [address]   SARS CORONAVIRUS 2 (TAT 6-24 HRS) Nasopharyngeal Nasopharyngeal Swab     Status: None   Collection Time: 05/20/20  5:17 PM   Specimen: Nasopharyngeal Swab  Result Value Ref Range Status   SARS Coronavirus 2 NEGATIVE NEGATIVE Final    Comment: (NOTE) SARS-CoV-2 target nucleic acids are NOT DETECTED.  The SARS-CoV-2 RNA is generally detectable in upper and lower respiratory specimens during the acute phase of infection. Negative results do not preclude SARS-CoV-2 infection, do not rule out co-infections with other pathogens, and should not be used as the sole basis for treatment or other patient management decisions. Negative results must be combined with clinical observations, patient history, and epidemiological information. The expected result is Negative.  Fact Sheet for Patients: SugarRoll.be  Fact Sheet for Healthcare Providers: https://www.woods-mathews.com/  This test is not yet approved or cleared by the [place] FDA and  has been authorized for detection and/or diagnosis of SARS-CoV-2 by FDA under an Emergency Use Authorization (EUA). This EUA will remain  in effect (meaning this test can be used)  for the duration of the COVID-19 declaration under Se ction 564(b)(1) of the Act, 21 U.S.C. section 360bbb-3(b)(1), unless the authorization is terminated or revoked sooner.  Performed at [facility], [place] [address] [place], [place] [address]      Labs: BNP (last 3 results) No results for input(s): BNP in the last 8760 hours. Basic Metabolic Panel: Recent Labs  Lab 05/17/20 0600 05/18/20 0404 05/19/20 0440 05/20/20 0446 05/21/20 0429  NA 137 135 140 142 145  K 3.8 3.9 4.1 3.8 3.6  CL 98 100 105 107 104  CO2 27 27 28 27 30   GLUCOSE 140* 114* 84 99 105*  BUN 21 33* 42* 45* 39*  CREATININE 1.10* 1.53* 1.32* 1.12* 0.91  CALCIUM 9.5 8.9 8.6* 8.8* 9.4  MG 1.9  --   --   --   --    Liver Function Tests: Recent Labs  Lab 05/16/20 1130  AST 25  ALT 14  ALKPHOS 58  BILITOT 0.9  PROT 7.7  ALBUMIN 4.0   No results for input(s): LIPASE, AMYLASE in the last 168 hours. No results for input(s): AMMONIA in the last 168 hours. CBC: Recent Labs  Lab 05/16/20 1130 05/16/20 1130 05/17/20 0600 05/18/20 0404 05/19/20 0440 05/20/20 0446 05/21/20 0429  WBC 6.7   < > 7.6 8.4 9.4 8.6 7.7  NEUTROABS 5.4  --   --   --   --   --   --   HGB 10.7*   < > 11.8* 9.9* 9.1* 8.4* 9.1*  HCT 31.9*   < > 34.4* 29.9* 28.0* 25.8* 27.2*  MCV 80.2   < > 78.5* 79.1* 81.2 80.6 79.1*  PLT 162   < > 169 159 150 187 225   < > = values in this interval not displayed.   Cardiac Enzymes: No results for input(s): CKTOTAL, CKMB, CKMBINDEX, TROPONINI in the last 168 hours. BNP: Invalid input(s): POCBNP CBG: No results for input(s): GLUCAP in the last 168 hours. D-Dimer No results for input(s): DDIMER in the last 72 hours. Hgb A1c No results for input(s): HGBA1C in the last 72 hours. Lipid Profile No results for input(s): CHOL, HDL, LDLCALC, TRIG, CHOLHDL, LDLDIRECT in the last 72 hours. Thyroid function studies No results for input(s): TSH, T4TOTAL, T3FREE, THYROIDAB in the last 72  hours.  Invalid input(s): FREET3 Anemia work up No results for input(s): VITAMINB12, FOLATE,  FERRITIN, TIBC, IRON, RETICCTPCT in the last 72 hours. Urinalysis    Component Value Date/Time   COLORURINE STRAW (A) 05/19/2020 1330   APPEARANCEUR CLEAR (A) 05/19/2020 1330   APPEARANCEUR Cloudy (A) 06/08/2018 1051   LABSPEC 1.010 05/19/2020 1330   PHURINE 5.0 05/19/2020 1330   GLUCOSEU NEGATIVE 05/19/2020 1330   HGBUR MODERATE (A) 05/19/2020 1330   BILIRUBINUR NEGATIVE 05/19/2020 1330   BILIRUBINUR Negative 06/08/2018 1051   KETONESUR 5 (A) 05/19/2020 1330   PROTEINUR NEGATIVE 05/19/2020 1330   NITRITE NEGATIVE 05/19/2020 1330   LEUKOCYTESUR NEGATIVE 05/19/2020 1330   Sepsis Labs Invalid input(s): PROCALCITONIN,  WBC,  LACTICIDVEN Microbiology Recent Results (from the past 240 hour(s))  SARS Coronavirus 2 by RT PCR (hospital order, performed in [place] hospital lab) Nasopharyngeal Nasopharyngeal Swab     Status: None   Collection Time: 05/16/20 11:30 AM   Specimen: Nasopharyngeal Swab  Result Value Ref Range Status   SARS Coronavirus 2 NEGATIVE NEGATIVE Final    Comment: (NOTE) SARS-CoV-2 target nucleic acids are NOT DETECTED.  The SARS-CoV-2 RNA is generally detectable in upper and lower respiratory specimens during the acute phase of infection. The lowest concentration of SARS-CoV-2 viral copies this assay can detect is 250 copies / mL. A negative result does not preclude SARS-CoV-2 infection and should not be used as the sole basis for treatment or other patient management decisions.  A negative result may occur with improper specimen collection / handling, submission of specimen other than nasopharyngeal swab, presence of viral mutation(s) within the areas targeted by this assay, and inadequate number of viral copies (<250 copies / mL). A negative result must be combined with clinical observations, patient history, and epidemiological information.  Fact Sheet for  Patients:   StrictlyIdeas.no  Fact Sheet for Healthcare Providers: BankingDealers.co.za  This test is not yet approved or  cleared by the [place] FDA and has been authorized for detection and/or diagnosis of SARS-CoV-2 by FDA under an Emergency Use Authorization (EUA).  This EUA will remain in effect (meaning this test can be used) for the duration of the COVID-19 declaration under Section 564(b)(1) of the Act, 21 U.S.C. section 360bbb-3(b)(1), unless the authorization is terminated or revoked sooner.  Performed at [facility], [address] [place], [place] [address]   Surgical PCR screen     Status: Abnormal   Collection Time: 05/16/20 11:34 PM   Specimen: Nasal Mucosa; Nasal Swab  Result Value Ref Range Status   MRSA, PCR NEGATIVE NEGATIVE Final   Staphylococcus aureus POSITIVE (A) NEGATIVE Final    Comment: (NOTE) The Xpert SA Assay (FDA approved for NASAL specimens in patients 67 years of age and older), is one component of a comprehensive surveillance program. It is not intended to diagnose infection nor to guide or monitor treatment. Performed at [facility], [place], [place] [address]   SARS CORONAVIRUS 2 (TAT 6-24 HRS) Nasopharyngeal Nasopharyngeal Swab     Status: None   Collection Time: 05/20/20  5:17 PM   Specimen: Nasopharyngeal Swab  Result Value Ref Range Status   SARS Coronavirus 2 NEGATIVE NEGATIVE Final    Comment: (NOTE) SARS-CoV-2 target nucleic acids are NOT DETECTED.  The SARS-CoV-2 RNA is generally detectable in upper and lower respiratory specimens during the acute phase of infection. Negative results do not preclude SARS-CoV-2 infection, do not rule out co-infections with other pathogens, and should not be used as the sole basis for treatment or other patient management decisions.  Negative results must be combined with clinical observations, patient  history, and epidemiological information. The expected result is Negative.  Fact Sheet for Patients: SugarRoll.be  Fact Sheet for Healthcare Providers: https://www.woods-mathews.com/  This test is not yet approved or cleared by the [place] FDA and  has been authorized for detection and/or diagnosis of SARS-CoV-2 by FDA under an Emergency Use Authorization (EUA). This EUA will remain  in effect (meaning this test can be used) for the duration of the COVID-19 declaration under Se ction 564(b)(1) of the Act, 21 U.S.C. section 360bbb-3(b)(1), unless the authorization is terminated or revoked sooner.  Performed at [facility], [place] [address] [place], [place] [address]      Time coordinating discharge: Over 30 minutes  SIGNED:   Nolberto Hanlon, MD  [facility] 05/22/2020, 12:53 PM Pager   If 7PM-7AM, please contact night-coverage [email or web address] Password [email or web address]

## 2020-05-23 DIAGNOSIS — I129 Hypertensive chronic kidney disease with stage 1 through stage 4 chronic kidney disease, or unspecified chronic kidney disease: Secondary | ICD-10-CM | POA: Diagnosis not present

## 2020-05-23 DIAGNOSIS — N183 Chronic kidney disease, stage 3 unspecified: Secondary | ICD-10-CM | POA: Diagnosis not present

## 2020-05-23 DIAGNOSIS — Z4789 Encounter for other orthopedic aftercare: Secondary | ICD-10-CM | POA: Diagnosis not present

## 2020-05-23 NOTE — Progress Notes (Signed)
Pt needs a regular follow up

## 2020-05-27 ENCOUNTER — Telehealth: Payer: Self-pay

## 2020-05-27 NOTE — Telephone Encounter (Signed)
Spoke with patient's daughter and after a fall she broke her hip and after being released from hospital she went to [facility] health care, they did not want to schedule follow up at moment due to rehab, family will call and schedule once she is discharged. Beth

## 2020-05-28 DIAGNOSIS — S72041D Displaced fracture of base of neck of right femur, subsequent encounter for closed fracture with routine healing: Secondary | ICD-10-CM | POA: Diagnosis not present

## 2020-05-28 DIAGNOSIS — Z4789 Encounter for other orthopedic aftercare: Secondary | ICD-10-CM | POA: Diagnosis not present

## 2020-05-28 DIAGNOSIS — R262 Difficulty in walking, not elsewhere classified: Secondary | ICD-10-CM | POA: Diagnosis not present

## 2020-05-28 DIAGNOSIS — E039 Hypothyroidism, unspecified: Secondary | ICD-10-CM | POA: Diagnosis not present

## 2020-05-31 DIAGNOSIS — Z09 Encounter for follow-up examination after completed treatment for conditions other than malignant neoplasm: Secondary | ICD-10-CM | POA: Diagnosis not present

## 2020-06-03 DIAGNOSIS — N1831 Chronic kidney disease, stage 3a: Secondary | ICD-10-CM | POA: Diagnosis not present

## 2020-06-03 DIAGNOSIS — D378 Neoplasm of uncertain behavior of other specified digestive organs: Secondary | ICD-10-CM | POA: Diagnosis not present

## 2020-06-03 DIAGNOSIS — E890 Postprocedural hypothyroidism: Secondary | ICD-10-CM | POA: Diagnosis not present

## 2020-06-04 ENCOUNTER — Telehealth: Payer: Self-pay

## 2020-06-04 DIAGNOSIS — R262 Difficulty in walking, not elsewhere classified: Secondary | ICD-10-CM | POA: Diagnosis not present

## 2020-06-04 NOTE — Telephone Encounter (Signed)
CONFIRMED PT APPT-OFFICE-AR 

## 2020-06-06 ENCOUNTER — Encounter: Payer: Self-pay | Admitting: Adult Health

## 2020-06-06 ENCOUNTER — Ambulatory Visit (INDEPENDENT_AMBULATORY_CARE_PROVIDER_SITE_OTHER): Payer: Medicare Other | Admitting: Adult Health

## 2020-06-06 ENCOUNTER — Other Ambulatory Visit: Payer: Self-pay

## 2020-06-06 VITALS — BP 122/85 | HR 78 | Temp 97.1°F | Resp 16 | Ht 62.0 in | Wt 86.8 lb

## 2020-06-06 DIAGNOSIS — S72001D Fracture of unspecified part of neck of right femur, subsequent encounter for closed fracture with routine healing: Secondary | ICD-10-CM

## 2020-06-06 DIAGNOSIS — I10 Essential (primary) hypertension: Secondary | ICD-10-CM | POA: Diagnosis not present

## 2020-06-06 DIAGNOSIS — I700 Atherosclerosis of aorta: Secondary | ICD-10-CM

## 2020-06-06 DIAGNOSIS — S31000A Unspecified open wound of lower back and pelvis without penetration into retroperitoneum, initial encounter: Secondary | ICD-10-CM

## 2020-06-06 NOTE — Progress Notes (Signed)
[facility] [place], [place] [address]  Internal MEDICINE  Office Visit Note  Patient Name: Joanna Aguilar  [id number]  [id number]  Date of Service: 06/06/2020  Chief Complaint  Patient presents with  . Hospitalization Follow-up    bed sore on buttock     HPI  Pt is seen at this time for hospital follow up. She presents today reporting that she had a fall while trying to come down the attic stairs.  She fell and fractured her right hip.  She went to [facility], and subsequently had surgery on the next day.  She was discharged from the hospital to rehab at [facility]. She was discharged on 06/03/2020.  She is walking without a walker or cane at this time.  She is joined in the exam room by her daughter.  She has a history of dementia.  She is concerned because she developed a sacral bedsore while at [facility] care.      Current Medication: Outpatient Encounter Medications as of 06/06/2020  Medication Sig  . amLODipine (NORVASC) 5 MG tablet TAKE 1 TABLET BY MOUTH EVERY MORNING FOR HTN (Patient taking differently: Take 5 mg by mouth daily. )  . bisacodyl (DULCOLAX) 10 MG suppository Place 1 suppository (10 mg total) rectally daily as needed for moderate constipation.  . docusate sodium (COLACE) 100 MG capsule Take 1 capsule (100 mg total) by mouth 2 (two) times daily.  Marland Kitchen donepezil (ARICEPT) 10 MG tablet TAKE ONE TABLET BY MOUTH ONCE DAILY IN THE MORNING FOR MEMORY (Patient taking differently: Take 10 mg by mouth daily. )  . ergocalciferol (DRISDOL) 1.25 MG (50000 UT) capsule Take 1 capsule (50,000 Units total) by mouth once a week.  . feeding supplement, ENSURE ENLIVE, (ENSURE ENLIVE) LIQD Take 237 mLs by mouth daily.  . ferrous sulfate 325 (65 FE) MG tablet Take 1 tablet (325 mg total) by mouth 2 (two) times daily with a meal.  . magnesium citrate SOLN Take 296 mLs (1 Bottle total) by mouth once as needed for severe constipation.  . memantine (NAMENDA)  5 MG tablet TAKE 1 TABLET BY MOUTH EVERY DAY FOR MEMORY (Patient taking differently: Take 5 mg by mouth daily. )  . Multiple Vitamin (MULTIVITAMIN WITH MINERALS) TABS tablet Take 1 tablet by mouth daily.  . nutrition supplement, JUVEN, (JUVEN) PACK Take 1 packet by mouth 2 (two) times daily between meals.  . polyethylene glycol (MIRALAX / GLYCOLAX) 17 g packet Take 17 g by mouth daily.  Marland Kitchen senna (SENOKOT) 8.6 MG TABS tablet Take 1 tablet (8.6 mg total) by mouth 2 (two) times daily.  Marland Kitchen SYNTHROID 88 MCG tablet Take 88 mcg by mouth daily.  Marland Kitchen triamcinolone cream (KENALOG) 0.1 % Apply 1 application topically 2 (two) times daily.  Marland Kitchen enoxaparin (LOVENOX) 30 MG/0.3ML injection Inject 0.3 mLs (30 mg total) into the skin daily for 9 days.   No facility-administered encounter medications on file as of 06/06/2020.    Surgical History: Past Surgical History:  Procedure Laterality Date  . ABDOMINAL HYSTERECTOMY    . TOTAL HIP ARTHROPLASTY Right 05/17/2020   Procedure: Hemi HIP ARTHROPLASTY;  Surgeon: Thornton Park, MD;  Location: [facility] ORS;  Service: Orthopedics;  Laterality: Right;    Medical History: Past Medical History:  Diagnosis Date  . Dementia ([place])   . Hypertension   . Thyroid disease     Family History: Family History  Problem Relation Age of Onset  . Diabetes Neg Hx   .  Arthritis Neg Hx     Social History   Socioeconomic History  . Marital status: Widowed    Spouse name: Not on file  . Number of children: Not on file  . Years of education: Not on file  . Highest education level: Not on file  Occupational History  . Not on file  Tobacco Use  . Smoking status: Never Smoker  . Smokeless tobacco: Never Used  Vaping Use  . Vaping Use: Never used  Substance and Sexual Activity  . Alcohol use: No  . Drug use: Never  . Sexual activity: Not on file  Other Topics Concern  . Not on file  Social History Narrative  . Not on file   Social Determinants of Health   Financial  Resource Strain:   . Difficulty of Paying Living Expenses:   Food Insecurity:   . Worried About Charity fundraiser in the Last Year:   . Arboriculturist in the Last Year:   Transportation Needs:   . Film/video editor (Medical):   Marland Kitchen Lack of Transportation (Non-Medical):   Physical Activity:   . Days of Exercise per Week:   . Minutes of Exercise per Session:   Stress:   . Feeling of Stress :   Social Connections:   . Frequency of Communication with Friends and Family:   . Frequency of Social Gatherings with Friends and Family:   . Attends Religious Services:   . Active Member of Clubs or Organizations:   . Attends Archivist Meetings:   Marland Kitchen Marital Status:   Intimate Partner Violence:   . Fear of Current or Ex-Partner:   . Emotionally Abused:   Marland Kitchen Physically Abused:   . Sexually Abused:       Review of Systems  Constitutional: Negative for chills, fatigue and unexpected weight change.  HENT: Negative for congestion, rhinorrhea, sneezing and sore throat.   Eyes: Negative for photophobia, pain and redness.  Respiratory: Negative for cough, chest tightness and shortness of breath.   Cardiovascular: Negative for chest pain and palpitations.  Gastrointestinal: Negative for abdominal pain, constipation, diarrhea, nausea and vomiting.  Endocrine: Negative.   Genitourinary: Negative for dysuria and frequency.  Musculoskeletal: Negative for arthralgias, back pain, joint swelling and neck pain.  Skin: Negative for rash.  Allergic/Immunologic: Negative.   Neurological: Negative for tremors and numbness.  Hematological: Negative for adenopathy. Does not bruise/bleed easily.  Psychiatric/Behavioral: Negative for behavioral problems and sleep disturbance. The patient is not nervous/anxious.     Vital Signs: BP 122/85   Pulse 78   Temp (!) 97.1 F (36.2 C)   Resp 16   Ht 5\' 2"  (1.575 m)   Wt (!) 86 lb 12.8 oz (39.4 kg)   SpO2 97%   BMI 15.88 kg/m    Physical  Exam Vitals and nursing note reviewed.  Constitutional:      General: She is not in acute distress.    Appearance: She is well-developed. She is not diaphoretic.  HENT:     Head: Normocephalic and atraumatic.     Mouth/Throat:     Pharynx: No oropharyngeal exudate.  Eyes:     Pupils: Pupils are equal, round, and reactive to light.  Neck:     Thyroid: No thyromegaly.     Vascular: No JVD.     Trachea: No tracheal deviation.  Cardiovascular:     Rate and Rhythm: Normal rate and regular rhythm.     Heart sounds: Normal heart  sounds. No murmur heard.  No friction rub. No gallop.   Pulmonary:     Effort: Pulmonary effort is normal. No respiratory distress.     Breath sounds: Normal breath sounds. No wheezing or rales.  Chest:     Chest wall: No tenderness.  Abdominal:     Palpations: Abdomen is soft.     Tenderness: There is no abdominal tenderness. There is no guarding.  Musculoskeletal:        General: Normal range of motion.     Cervical back: Normal range of motion and neck supple.  Lymphadenopathy:     Cervical: No cervical adenopathy.  Skin:    General: Skin is warm and dry.  Neurological:     Mental Status: She is alert and oriented to person, place, and time.     Cranial Nerves: No cranial nerve deficit.  Psychiatric:        Behavior: Behavior normal.        Thought Content: Thought content normal.        Judgment: Judgment normal.    Assessment/Plan: 1. Closed fracture of right hip with routine healing, subsequent encounter Currently doing well, continue with rehab as directed.   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   3. Essential hypertension Stable, continue current medications  4. Sacral wound, initial encounter Appears closed today, continue to monitor and keep pressure off as much as possible. Reposition every 2 hours.   General Counseling: shruti arrey understanding of the findings of todays visit and agrees with  plan of treatment. I have discussed any further diagnostic evaluation that may be needed or ordered today. We also reviewed her medications today. she has been encouraged to call the office with any questions or concerns that should arise related to todays visit.    No orders of the defined types were placed in this encounter.   No orders of the defined types were placed in this encounter.   Time spent: 30 Minutes   This patient was seen by Orson Gear AGNP-C in Collaboration with Dr Lavera Guise as a part of collaborative care agreement     Kendell Bane AGNP-C Internal medicine

## 2020-06-20 ENCOUNTER — Ambulatory Visit: Payer: Medicare Other | Admitting: Nurse Practitioner

## 2020-06-20 DIAGNOSIS — E890 Postprocedural hypothyroidism: Secondary | ICD-10-CM | POA: Diagnosis not present

## 2020-06-20 DIAGNOSIS — N1831 Chronic kidney disease, stage 3a: Secondary | ICD-10-CM | POA: Diagnosis not present

## 2020-06-20 DIAGNOSIS — I129 Hypertensive chronic kidney disease with stage 1 through stage 4 chronic kidney disease, or unspecified chronic kidney disease: Secondary | ICD-10-CM | POA: Diagnosis not present

## 2020-06-20 DIAGNOSIS — I7090 Unspecified atherosclerosis: Secondary | ICD-10-CM | POA: Diagnosis not present

## 2020-06-20 DIAGNOSIS — Z9181 History of falling: Secondary | ICD-10-CM | POA: Diagnosis not present

## 2020-06-20 DIAGNOSIS — S72401D Unspecified fracture of lower end of right femur, subsequent encounter for closed fracture with routine healing: Secondary | ICD-10-CM | POA: Diagnosis not present

## 2020-06-21 DIAGNOSIS — I129 Hypertensive chronic kidney disease with stage 1 through stage 4 chronic kidney disease, or unspecified chronic kidney disease: Secondary | ICD-10-CM | POA: Diagnosis not present

## 2020-06-21 DIAGNOSIS — N1831 Chronic kidney disease, stage 3a: Secondary | ICD-10-CM | POA: Diagnosis not present

## 2020-06-21 DIAGNOSIS — E890 Postprocedural hypothyroidism: Secondary | ICD-10-CM | POA: Diagnosis not present

## 2020-06-21 DIAGNOSIS — I7090 Unspecified atherosclerosis: Secondary | ICD-10-CM | POA: Diagnosis not present

## 2020-06-21 DIAGNOSIS — Z9181 History of falling: Secondary | ICD-10-CM | POA: Diagnosis not present

## 2020-06-21 DIAGNOSIS — S72401D Unspecified fracture of lower end of right femur, subsequent encounter for closed fracture with routine healing: Secondary | ICD-10-CM | POA: Diagnosis not present

## 2020-06-25 DIAGNOSIS — N1831 Chronic kidney disease, stage 3a: Secondary | ICD-10-CM | POA: Diagnosis not present

## 2020-06-25 DIAGNOSIS — I129 Hypertensive chronic kidney disease with stage 1 through stage 4 chronic kidney disease, or unspecified chronic kidney disease: Secondary | ICD-10-CM | POA: Diagnosis not present

## 2020-06-25 DIAGNOSIS — Z9181 History of falling: Secondary | ICD-10-CM | POA: Diagnosis not present

## 2020-06-25 DIAGNOSIS — S72401D Unspecified fracture of lower end of right femur, subsequent encounter for closed fracture with routine healing: Secondary | ICD-10-CM | POA: Diagnosis not present

## 2020-06-25 DIAGNOSIS — E890 Postprocedural hypothyroidism: Secondary | ICD-10-CM | POA: Diagnosis not present

## 2020-06-25 DIAGNOSIS — I7090 Unspecified atherosclerosis: Secondary | ICD-10-CM | POA: Diagnosis not present

## 2020-06-27 DIAGNOSIS — Z9181 History of falling: Secondary | ICD-10-CM | POA: Diagnosis not present

## 2020-06-27 DIAGNOSIS — I129 Hypertensive chronic kidney disease with stage 1 through stage 4 chronic kidney disease, or unspecified chronic kidney disease: Secondary | ICD-10-CM | POA: Diagnosis not present

## 2020-06-27 DIAGNOSIS — S72401D Unspecified fracture of lower end of right femur, subsequent encounter for closed fracture with routine healing: Secondary | ICD-10-CM | POA: Diagnosis not present

## 2020-06-27 DIAGNOSIS — E890 Postprocedural hypothyroidism: Secondary | ICD-10-CM | POA: Diagnosis not present

## 2020-06-27 DIAGNOSIS — I7090 Unspecified atherosclerosis: Secondary | ICD-10-CM | POA: Diagnosis not present

## 2020-06-27 DIAGNOSIS — N1831 Chronic kidney disease, stage 3a: Secondary | ICD-10-CM | POA: Diagnosis not present

## 2020-07-04 DIAGNOSIS — N1831 Chronic kidney disease, stage 3a: Secondary | ICD-10-CM | POA: Diagnosis not present

## 2020-07-04 DIAGNOSIS — S72401D Unspecified fracture of lower end of right femur, subsequent encounter for closed fracture with routine healing: Secondary | ICD-10-CM | POA: Diagnosis not present

## 2020-07-04 DIAGNOSIS — Z9181 History of falling: Secondary | ICD-10-CM | POA: Diagnosis not present

## 2020-07-04 DIAGNOSIS — E890 Postprocedural hypothyroidism: Secondary | ICD-10-CM | POA: Diagnosis not present

## 2020-07-04 DIAGNOSIS — I7090 Unspecified atherosclerosis: Secondary | ICD-10-CM | POA: Diagnosis not present

## 2020-07-04 DIAGNOSIS — I129 Hypertensive chronic kidney disease with stage 1 through stage 4 chronic kidney disease, or unspecified chronic kidney disease: Secondary | ICD-10-CM | POA: Diagnosis not present

## 2020-07-08 DIAGNOSIS — Z09 Encounter for follow-up examination after completed treatment for conditions other than malignant neoplasm: Secondary | ICD-10-CM | POA: Diagnosis not present

## 2020-07-09 DIAGNOSIS — R410 Disorientation, unspecified: Secondary | ICD-10-CM | POA: Diagnosis not present

## 2020-07-09 DIAGNOSIS — R6889 Other general symptoms and signs: Secondary | ICD-10-CM | POA: Diagnosis not present

## 2020-07-09 DIAGNOSIS — Z743 Need for continuous supervision: Secondary | ICD-10-CM | POA: Diagnosis not present

## 2020-07-10 DIAGNOSIS — S72401D Unspecified fracture of lower end of right femur, subsequent encounter for closed fracture with routine healing: Secondary | ICD-10-CM | POA: Diagnosis not present

## 2020-07-10 DIAGNOSIS — I7090 Unspecified atherosclerosis: Secondary | ICD-10-CM | POA: Diagnosis not present

## 2020-07-10 DIAGNOSIS — E890 Postprocedural hypothyroidism: Secondary | ICD-10-CM | POA: Diagnosis not present

## 2020-07-10 DIAGNOSIS — Z9181 History of falling: Secondary | ICD-10-CM | POA: Diagnosis not present

## 2020-07-10 DIAGNOSIS — N1831 Chronic kidney disease, stage 3a: Secondary | ICD-10-CM | POA: Diagnosis not present

## 2020-07-10 DIAGNOSIS — I129 Hypertensive chronic kidney disease with stage 1 through stage 4 chronic kidney disease, or unspecified chronic kidney disease: Secondary | ICD-10-CM | POA: Diagnosis not present

## 2020-07-17 ENCOUNTER — Telehealth: Payer: Self-pay

## 2020-07-17 ENCOUNTER — Encounter: Payer: Self-pay | Admitting: Hospice and Palliative Medicine

## 2020-07-17 ENCOUNTER — Ambulatory Visit (INDEPENDENT_AMBULATORY_CARE_PROVIDER_SITE_OTHER): Payer: Medicare Other | Admitting: Hospice and Palliative Medicine

## 2020-07-17 DIAGNOSIS — Z711 Person with feared health complaint in whom no diagnosis is made: Secondary | ICD-10-CM

## 2020-07-17 DIAGNOSIS — R627 Adult failure to thrive: Secondary | ICD-10-CM

## 2020-07-17 NOTE — Telephone Encounter (Signed)
Called hospice and gave them verbal and  Put order in epic

## 2020-07-17 NOTE — Progress Notes (Signed)
[facility] [place], [place] [address]  Internal MEDICINE  Telephone Visit  Patient Name: Joanna Aguilar  [id number]  [id number]  Date of Service: 07/18/2020  I connected with the patient at 1057 by telephone and verified the patients identity using two identifiers.   I discussed the limitations, risks, security and privacy concerns of performing an evaluation and management service by telephone and the availability of in person appointments. I also discussed with the patient that there may be a patient responsible charge related to the service.  The patient expressed understanding and agrees to proceed.    Chief Complaint  Patient presents with  . Telephone Assessment    broke Joanna hip , wont eat,just keep sleeping   . Telephone Screen  . Weight Loss    significant weight loss    HPI Patient is being seen today for sick visit. Spoke to daughter on the phone for visit. Daughter is very concerned about Joanna Aguilar. Joanna Aguilar has not had anymore to eat than a few spoonfuls of applesauce in about 4 days. Joanna Aguilar is sleeping most of the day, not aroused easily. She is concerned that since Joanna his repair surgery at the end of July she has noticed a significant decline in Joanna Aguilar. She feels Joanna dementia has significantly worsened during this time. She wants Joanna Aguilar be comfortable and to remain free from pain.  Current Medication: Outpatient Encounter Medications as of 07/17/2020  Medication Sig  . amLODipine (NORVASC) 5 MG tablet TAKE 1 TABLET BY MOUTH EVERY MORNING FOR HTN (Patient taking differently: Take 5 mg by mouth daily. )  . bisacodyl (DULCOLAX) 10 MG suppository Place 1 suppository (10 mg total) rectally daily as needed for moderate constipation.  . docusate sodium (COLACE) 100 MG capsule Take 1 capsule (100 mg total) by mouth 2 (two) times daily.  Marland Kitchen donepezil (ARICEPT) 10 MG tablet TAKE ONE TABLET BY MOUTH ONCE DAILY IN THE MORNING FOR MEMORY  (Patient taking differently: Take 10 mg by mouth daily. )  . ergocalciferol (DRISDOL) 1.25 MG (50000 UT) capsule Take 1 capsule (50,000 Units total) by mouth once a week.  . feeding supplement, ENSURE ENLIVE, (ENSURE ENLIVE) LIQD Take 237 mLs by mouth daily.  . ferrous sulfate 325 (65 FE) MG tablet Take 1 tablet (325 mg total) by mouth 2 (two) times daily with a meal.  . magnesium citrate SOLN Take 296 mLs (1 Bottle total) by mouth once as needed for severe constipation.  . memantine (NAMENDA) 5 MG tablet TAKE 1 TABLET BY MOUTH EVERY DAY FOR MEMORY (Patient taking differently: Take 5 mg by mouth daily. )  . Multiple Vitamin (MULTIVITAMIN WITH MINERALS) TABS tablet Take 1 tablet by mouth daily.  . nutrition supplement, JUVEN, (JUVEN) PACK Take 1 packet by mouth 2 (two) times daily between meals.  . polyethylene glycol (MIRALAX / GLYCOLAX) 17 g packet Take 17 g by mouth daily.  Marland Kitchen senna (SENOKOT) 8.6 MG TABS tablet Take 1 tablet (8.6 mg total) by mouth 2 (two) times daily.  Marland Kitchen SYNTHROID 88 MCG tablet Take 88 mcg by mouth daily.  Marland Kitchen triamcinolone cream (KENALOG) 0.1 % Apply 1 application topically 2 (two) times daily.  Marland Kitchen enoxaparin (LOVENOX) 30 MG/0.3ML injection Inject 0.3 mLs (30 mg total) into the skin daily for 9 days.   No facility-administered encounter medications on file as of 07/17/2020.    Surgical History: Past Surgical History:  Procedure Laterality Date  . ABDOMINAL HYSTERECTOMY    .  TOTAL HIP ARTHROPLASTY Right 05/17/2020   Procedure: Hemi HIP ARTHROPLASTY;  Surgeon: Thornton Park, MD;  Location: [facility] ORS;  Service: Orthopedics;  Laterality: Right;    Medical History: Past Medical History:  Diagnosis Date  . Dementia ([place])   . Hypertension   . Thyroid disease     Family History: Family History  Problem Relation Age of Onset  . Diabetes Neg Hx   . Arthritis Neg Hx     Social History   Socioeconomic History  . Marital status: Widowed    Spouse name: Not on file  .  Number of children: Not on file  . Years of education: Not on file  . Highest education level: Not on file  Occupational History  . Not on file  Tobacco Use  . Smoking status: Never Smoker  . Smokeless tobacco: Never Used  Vaping Use  . Vaping Use: Never used  Substance and Sexual Activity  . Alcohol use: No  . Drug use: Never  . Sexual activity: Not on file  Other Topics Concern  . Not on file  Social History Narrative  . Not on file   Social Determinants of Health   Financial Resource Strain:   . Difficulty of Paying Living Expenses: Not on file  Food Insecurity:   . Worried About Charity fundraiser in the Last Year: Not on file  . Ran Out of Food in the Last Year: Not on file  Transportation Needs:   . Lack of Transportation (Medical): Not on file  . Lack of Transportation (Non-Medical): Not on file  Physical Activity:   . Days of Exercise per Week: Not on file  . Minutes of Exercise per Session: Not on file  Stress:   . Feeling of Stress : Not on file  Social Connections:   . Frequency of Communication with Friends and Family: Not on file  . Frequency of Social Gatherings with Friends and Family: Not on file  . Attends Religious Services: Not on file  . Active Member of Clubs or Organizations: Not on file  . Attends Archivist Meetings: Not on file  . Marital Status: Not on file  Intimate Partner Violence:   . Fear of Current or Ex-Partner: Not on file  . Emotionally Abused: Not on file  . Physically Abused: Not on file  . Sexually Abused: Not on file    Vital Signs: Resp 16   Ht 5\' 2"  (1.575 m)   Wt 50 lb (22.7 kg)   BMI 9.15 kg/m    Observation/Objective: Spoke to daughter about end of life care. According to Joanna report it seems Joanna Aguilar is approaching end of life. With Joanna daughters permission I contacted Hospice services for them to do a home assessment and take over care.   Assessment/Plan: 1. Concern about end of life Stat referral  to Hospice to do an in home assessment for Joanna Aguilar. Since July, she has progressively decline. Underlying dementia progressively worsened since being hospitalized and underwent hip repair surgery. - Ambulatory referral to Hospice  2. Failure to thrive in adult Has had little to no nutrition in 3-4 days. According to Joanna daughter she approximately weighs 50 pounds. - Ambulatory referral to Hospice  General Counseling: Joanna Aguilar understanding of the findings of today's phone visit and agrees with plan of treatment. I have discussed any further diagnostic evaluation that may be needed or ordered today. We also reviewed Joanna medications today. she has been encouraged to call the  office with any questions or concerns that should arise related to todays visit.    Orders Placed This Encounter  Procedures  . Ambulatory referral to Hospice       Time spent: [place]. Sharolyn Weber AGNP-C Internal medicine

## 2020-07-18 ENCOUNTER — Telehealth: Payer: Self-pay

## 2020-07-18 NOTE — Telephone Encounter (Signed)
Spoke with christy from hospice she said nurse is going today to see her today and they also spoke with her daughter

## 2020-07-19 ENCOUNTER — Telehealth: Payer: Self-pay

## 2020-07-19 NOTE — Telephone Encounter (Signed)
Pt was admission with hospice and also as per dr Humphrey Rolls advised them to get there attending physicians for her

## 2020-08-16 DEATH — deceased

## 2020-11-01 ENCOUNTER — Telehealth: Payer: Self-pay

## 2020-11-01 NOTE — Telephone Encounter (Signed)
Plan of care signed by provider and faxed back to [facility] care at [phone].Copy placed in scan.
